# Patient Record
Sex: Male | Born: 1970
Health system: Southern US, Community
[De-identification: ages and names within clinical notes are randomized; demographics above are authoritative.]

## PROBLEM LIST (undated history)

## (undated) DIAGNOSIS — N200 Calculus of kidney: Secondary | ICD-10-CM

## (undated) DIAGNOSIS — B019 Varicella without complication: Secondary | ICD-10-CM

## (undated) DIAGNOSIS — T7840XA Allergy, unspecified, initial encounter: Secondary | ICD-10-CM

## (undated) DIAGNOSIS — E119 Type 2 diabetes mellitus without complications: Secondary | ICD-10-CM

## (undated) HISTORY — DX: Allergy, unspecified, initial encounter: T78.40XA

## (undated) HISTORY — PX: CHOLECYSTECTOMY: SHX55

## (undated) HISTORY — DX: Calculus of kidney: N20.0

## (undated) HISTORY — DX: Type 2 diabetes mellitus without complications: E11.9

## (undated) HISTORY — DX: Varicella without complication: B01.9

## (undated) HISTORY — PX: NO PAST SURGERIES: SHX2092

---

## 2010-01-04 ENCOUNTER — Emergency Department (HOSPITAL_BASED_OUTPATIENT_CLINIC_OR_DEPARTMENT_OTHER): Admission: EM | Admit: 2010-01-04 | Discharge: 2010-01-04 | Payer: Self-pay | Admitting: Emergency Medicine

## 2011-07-06 ENCOUNTER — Emergency Department: Payer: Self-pay | Admitting: *Deleted

## 2011-07-16 ENCOUNTER — Ambulatory Visit: Payer: Self-pay | Admitting: Urology

## 2011-12-20 ENCOUNTER — Emergency Department: Payer: Self-pay | Admitting: *Deleted

## 2013-01-20 DIAGNOSIS — J349 Unspecified disorder of nose and nasal sinuses: Secondary | ICD-10-CM | POA: Insufficient documentation

## 2014-06-17 ENCOUNTER — Emergency Department: Payer: Self-pay | Admitting: Emergency Medicine

## 2014-06-17 LAB — URINALYSIS, COMPLETE
BACTERIA: NONE SEEN
Bilirubin,UR: NEGATIVE
Blood: NEGATIVE
GLUCOSE, UR: NEGATIVE mg/dL (ref 0–75)
Ketone: NEGATIVE
Leukocyte Esterase: NEGATIVE
NITRITE: NEGATIVE
PH: 6 (ref 4.5–8.0)
Protein: NEGATIVE
RBC,UR: <1 /HPF (ref 0–5)
Specific Gravity: 1.015 (ref 1.003–1.030)
Squamous Epithelial: <1
WBC UR: 4 /HPF (ref 0–5)

## 2014-06-17 LAB — COMPREHENSIVE METABOLIC PANEL
Albumin: 4.2 g/dL (ref 3.4–5.0)
Alkaline Phosphatase: 90 U/L
Anion Gap: 5 — ABNORMAL LOW (ref 7–16)
BILIRUBIN TOTAL: 0.3 mg/dL (ref 0.2–1.0)
BUN: 10 mg/dL (ref 7–18)
Calcium, Total: 9.8 mg/dL (ref 8.5–10.1)
Chloride: 104 mmol/L (ref 98–107)
Co2: 29 mmol/L (ref 21–32)
Creatinine: 1.04 mg/dL (ref 0.60–1.30)
EGFR (African American): >60
Glucose: 105 mg/dL — ABNORMAL HIGH (ref 65–99)
Osmolality: 275 (ref 275–301)
POTASSIUM: 3.6 mmol/L (ref 3.5–5.1)
SGOT(AST): 36 U/L (ref 15–37)
SGPT (ALT): 60 U/L
Sodium: 138 mmol/L (ref 136–145)
Total Protein: 8 g/dL (ref 6.4–8.2)

## 2014-06-17 LAB — CBC WITH DIFFERENTIAL/PLATELET
BASOS PCT: 0.8 %
Basophil #: 0.1 10*3/uL (ref 0.0–0.1)
EOS ABS: 0.2 10*3/uL (ref 0.0–0.7)
Eosinophil %: 2.1 %
HCT: 48.1 % (ref 40.0–52.0)
HGB: 16 g/dL (ref 13.0–18.0)
LYMPHS PCT: 27.1 %
Lymphocyte #: 2.9 10*3/uL (ref 1.0–3.6)
MCH: 30 pg (ref 26.0–34.0)
MCHC: 33.2 g/dL (ref 32.0–36.0)
MCV: 90 fL (ref 80–100)
MONO ABS: 1 x10 3/mm (ref 0.2–1.0)
Monocyte %: 9.5 %
Neutrophil #: 6.5 10*3/uL (ref 1.4–6.5)
Neutrophil %: 60.5 %
PLATELETS: 287 10*3/uL (ref 150–440)
RBC: 5.32 10*6/uL (ref 4.40–5.90)
RDW: 13.3 % (ref 11.5–14.5)
WBC: 10.8 10*3/uL — ABNORMAL HIGH (ref 3.8–10.6)

## 2014-06-17 LAB — LIPASE, BLOOD: Lipase: 127 U/L (ref 73–393)

## 2014-06-17 LAB — TROPONIN I

## 2016-09-24 ENCOUNTER — Ambulatory Visit (INDEPENDENT_AMBULATORY_CARE_PROVIDER_SITE_OTHER): Payer: 59 | Admitting: Family Medicine

## 2016-09-24 ENCOUNTER — Encounter (INDEPENDENT_AMBULATORY_CARE_PROVIDER_SITE_OTHER): Payer: Self-pay

## 2016-09-24 ENCOUNTER — Encounter: Payer: Self-pay | Admitting: Family Medicine

## 2016-09-24 VITALS — BP 142/98 | HR 94 | Temp 98.4°F | Resp 14 | Ht 72.0 in | Wt 275.4 lb

## 2016-09-24 DIAGNOSIS — R030 Elevated blood-pressure reading, without diagnosis of hypertension: Secondary | ICD-10-CM | POA: Insufficient documentation

## 2016-09-24 DIAGNOSIS — Z23 Encounter for immunization: Secondary | ICD-10-CM | POA: Diagnosis not present

## 2016-09-24 DIAGNOSIS — Z13220 Encounter for screening for lipoid disorders: Secondary | ICD-10-CM | POA: Diagnosis not present

## 2016-09-24 DIAGNOSIS — Z7251 High risk heterosexual behavior: Secondary | ICD-10-CM | POA: Diagnosis not present

## 2016-09-24 DIAGNOSIS — Z113 Encounter for screening for infections with a predominantly sexual mode of transmission: Secondary | ICD-10-CM | POA: Diagnosis not present

## 2016-09-24 DIAGNOSIS — Z0000 Encounter for general adult medical examination without abnormal findings: Secondary | ICD-10-CM

## 2016-09-24 DIAGNOSIS — Z0001 Encounter for general adult medical examination with abnormal findings: Secondary | ICD-10-CM

## 2016-09-24 DIAGNOSIS — E669 Obesity, unspecified: Secondary | ICD-10-CM | POA: Diagnosis not present

## 2016-09-24 DIAGNOSIS — L409 Psoriasis, unspecified: Secondary | ICD-10-CM | POA: Diagnosis not present

## 2016-09-24 LAB — COMPREHENSIVE METABOLIC PANEL
ALBUMIN: 4.6 g/dL (ref 3.5–5.2)
ALK PHOS: 69 U/L (ref 39–117)
ALT: 41 U/L (ref 0–53)
AST: 26 U/L (ref 0–37)
BUN: 10 mg/dL (ref 6–23)
CO2: 26 mEq/L (ref 19–32)
CREATININE: 0.82 mg/dL (ref 0.40–1.50)
Calcium: 9.6 mg/dL (ref 8.4–10.5)
Chloride: 102 mEq/L (ref 96–112)
GFR: 107.41 mL/min (ref >60.00)
Glucose, Bld: 138 mg/dL — ABNORMAL HIGH (ref 70–99)
Potassium: 4.6 mEq/L (ref 3.5–5.1)
SODIUM: 137 meq/L (ref 135–145)
TOTAL PROTEIN: 7.2 g/dL (ref 6.0–8.3)
Total Bilirubin: 0.5 mg/dL (ref 0.2–1.2)

## 2016-09-24 LAB — LIPID PANEL
CHOL/HDL RATIO: 5
CHOLESTEROL: 168 mg/dL (ref 0–200)
HDL: 35.7 mg/dL — ABNORMAL LOW (ref >39.00)
NonHDL: 132.51
Triglycerides: 204 mg/dL — ABNORMAL HIGH (ref 0.0–149.0)
VLDL: 40.8 mg/dL — ABNORMAL HIGH (ref 0.0–40.0)

## 2016-09-24 LAB — LDL CHOLESTEROL, DIRECT: LDL DIRECT: 104 mg/dL

## 2016-09-24 MED ORDER — BETAMETHASONE DIPROPIONATE 0.05 % EX OINT: TOPICAL_OINTMENT | Freq: Two times a day (BID) | CUTANEOUS | 0 refills | Status: DC

## 2016-09-24 NOTE — Assessment & Plan Note (Signed)
New problem. Patient to return in 2 weeks for BP check. Labs today.

## 2016-09-24 NOTE — Progress Notes (Signed)
Subjective:  Patient ID: Christopher Booker, male    DOB: [birth year]  Age: 45 y.o. MRN: [id number]  CC: Establish care, Rash, Elevated  BP  HPI [name] is a 45 y.o. male presents to the clinic today to establish care. He is in need of an annual physical. Other issues are below.  Preventative Healthcare  Colonoscopy: Not yet indicated.  Immunizations  Tetanus - Unsure of last tetanus.   Pneumococcal - Not indicated.   Flu - Flu shot today.  Labs:  In need of screening labs today.  Exercise: No regular exercise.   Alcohol use: See below.  Smoking/tobacco use: Nonsmoker.  STD/HIV testing: Desires testing.  Regular dental exams: Yes.   Wears seat belt: Yes.   Rash  X 3 years.  Located on the anterior shins bilaterally.  No associated itching or pain.  No medications or interventions tried.  No known exacerbating or relieving factors.  No other associated symptoms.  Elevated BP  Has a history of elevated blood pressure.  He states that his blood pressure has been elevated as of late.  No associated symptoms.  No other complaints at this time.  PMH, Surgical Hx, Family Hx, Social History reviewed and updated as below.  Past Medical History:  Diagnosis Date  . Allergy   . Chicken pox   . Kidney stones    Past Surgical History:  Procedure Laterality Date  . NO PAST SURGERIES     Family History  Problem Relation Age of Onset  . Prostate cancer Maternal Grandfather   . Arthritis Paternal Grandmother    Social History  Substance Use Topics  . Smoking status: Never Smoker  . Smokeless tobacco: Never Used  . Alcohol use 3.0 oz/week    5 Standard drinks or equivalent per week   Review of Systems  HENT: Positive for tinnitus.   Musculoskeletal:       Knee pain.  Skin: Positive for rash.  Neurological: Positive for headaches.  All other systems reviewed and are negative.  Objective:   Today's Vitals: BP (!) 142/98 (BP Location: Right Arm, Patient  Position: Sitting, Cuff Size: Large)   Pulse 94   Temp 98.4 F (36.9 C) (Oral)   Resp 14   Ht 6' (1.829 m)   Wt 275 lb 6 oz (124.9 kg)   SpO2 95%   BMI 37.35 kg/m   Physical Exam  Constitutional: He is oriented to person, place, and time. He appears well-developed. No distress.  Obese.  HENT:  Head: Normocephalic and atraumatic.  Nose: Nose normal.  Mouth/Throat: Oropharynx is clear and moist. No oropharyngeal exudate.  Normal TM's bilaterally.   Eyes: Conjunctivae are normal. No scleral icterus.  Neck: Neck supple.  Cardiovascular: Normal rate and regular rhythm.   No murmur heard. Pulmonary/Chest: Effort normal and breath sounds normal. He has no wheezes. He has no rales.  Abdominal: Soft. He exhibits no distension. There is no tenderness. There is no rebound and no guarding.  Musculoskeletal: Normal range of motion. He exhibits no edema.  Lymphadenopathy:    He has no cervical adenopathy.  Neurological: He is alert and oriented to person, place, and time.  Skin:  Anterior lower legs with raised, scaly erythematous patches.  Psychiatric: He has a normal mood and affect.  Vitals reviewed.  Assessment & Plan:   Problem List Items Addressed This Visit    Screening examination for STD (sexually transmitted disease)   Relevant Orders   GC/Chlamydia Probe Amp   RPR  HIV antibody   Psoriasis    New problem. Rash appears consistent with psoriasis. Treating with topical steroid. Rx sent today.      Relevant Medications   betamethasone dipropionate (DIPROLENE) 0.05 % ointment   Other Relevant Orders   CBC   Encounter for preventative adult health care exam with abnormal findings - Primary    Flu shot given today. Screening labs today including STD testing (per patient request).      Relevant Orders   CBC   Hemoglobin A1c   Elevated BP without diagnosis of hypertension    New problem. Patient to return in 2 weeks for BP check. Labs today.        Other Visit  Diagnoses    Obesity (BMI 30-39.9)       Relevant Orders   Comp Met (CMET)   Screening, lipid       Relevant Orders   Lipid panel   Encounter for immunization       Relevant Orders   Flu Vaccine QUAD 36+ mos IM (Completed)     Outpatient Encounter Prescriptions as of 09/24/2016  Medication Sig  . betamethasone dipropionate (DIPROLENE) 0.05 % ointment Apply topically 2 (two) times daily.   No facility-administered encounter medications on file as of 09/24/2016.    Follow-up: Return in about 2 weeks (around 10/08/2016) for BP check - Nurse visit.  Brimhall Nizhoni

## 2016-09-24 NOTE — Patient Instructions (Signed)
Follow up in 2 weeks for a BP check.  Use the steroid as prescribed.  We will call with your lab results.  Take care  Dr. Adriana Simasook

## 2016-09-24 NOTE — Assessment & Plan Note (Signed)
New problem. Rash appears consistent with psoriasis. Treating with topical steroid. Rx sent today.

## 2016-09-24 NOTE — Progress Notes (Signed)
Pre visit review using our clinic review tool, if applicable. No additional management support is needed unless otherwise documented below in the visit note. 

## 2016-09-24 NOTE — Assessment & Plan Note (Signed)
Flu shot given today. Screening labs today including STD testing (per patient request).

## 2016-09-25 LAB — GC/CHLAMYDIA PROBE AMP
CT Probe RNA: NOT DETECTED
GC PROBE AMP APTIMA: NOT DETECTED

## 2016-09-30 ENCOUNTER — Other Ambulatory Visit (INDEPENDENT_AMBULATORY_CARE_PROVIDER_SITE_OTHER): Payer: 59

## 2016-09-30 ENCOUNTER — Telehealth: Payer: Self-pay | Admitting: *Deleted

## 2016-09-30 DIAGNOSIS — Z113 Encounter for screening for infections with a predominantly sexual mode of transmission: Secondary | ICD-10-CM

## 2016-09-30 DIAGNOSIS — L409 Psoriasis, unspecified: Secondary | ICD-10-CM

## 2016-09-30 DIAGNOSIS — Z0001 Encounter for general adult medical examination with abnormal findings: Secondary | ICD-10-CM | POA: Diagnosis not present

## 2016-09-30 LAB — CBC
HCT: 42.8 % (ref 39.0–52.0)
HEMOGLOBIN: 14.8 g/dL (ref 13.0–17.0)
MCHC: 34.6 g/dL (ref 30.0–36.0)
MCV: 87.1 fl (ref 78.0–100.0)
PLATELETS: 361 10*3/uL (ref 150.0–400.0)
RBC: 4.92 Mil/uL (ref 4.22–5.81)
RDW: 13.6 % (ref 11.5–15.5)
WBC: 8.2 10*3/uL (ref 4.0–10.5)

## 2016-09-30 LAB — HEMOGLOBIN A1C: Hgb A1c MFr Bld: 6.7 % — ABNORMAL HIGH (ref 4.6–6.5)

## 2016-09-30 NOTE — Telephone Encounter (Signed)
Pt was given labs and gave verbal understanding. 

## 2016-09-30 NOTE — Telephone Encounter (Signed)
Pt requested lab results  Pt contact [phone]

## 2016-10-01 LAB — RPR

## 2016-10-01 LAB — HIV ANTIBODY (ROUTINE TESTING W REFLEX): HIV: NONREACTIVE

## 2016-10-08 ENCOUNTER — Ambulatory Visit (INDEPENDENT_AMBULATORY_CARE_PROVIDER_SITE_OTHER): Payer: 59

## 2016-10-08 ENCOUNTER — Telehealth: Payer: Self-pay | Admitting: Family Medicine

## 2016-10-08 VITALS — BP 126/78 | HR 83 | Resp 16

## 2016-10-08 DIAGNOSIS — R030 Elevated blood-pressure reading, without diagnosis of hypertension: Secondary | ICD-10-CM | POA: Diagnosis not present

## 2016-10-08 NOTE — Telephone Encounter (Signed)
Pt called back returning your call. Thank you!  Call pt @ [phone]

## 2016-10-08 NOTE — Progress Notes (Signed)
Patient returns for 2 week  blood pressure check when blood pressure was elevated at last office visit.  Please advise.

## 2016-10-08 NOTE — Progress Notes (Signed)
Patient is not scheduled for return follow up appointment .  Do you want him to return in 6 months?  Please advise.

## 2016-10-08 NOTE — Progress Notes (Signed)
Prehypertension/elevated BP per new guidelines. Continue to monitor. Follow up as scheduled.   Everlene OtherJayce Christopher Picazo DO [facility] [place]

## 2016-10-08 NOTE — Progress Notes (Signed)
Left message to call.

## 2016-10-09 NOTE — Telephone Encounter (Signed)
Patient called back and advised.

## 2018-04-10 ENCOUNTER — Emergency Department: Payer: 59

## 2018-04-10 ENCOUNTER — Encounter: Payer: Self-pay | Admitting: *Deleted

## 2018-04-10 ENCOUNTER — Other Ambulatory Visit: Payer: Self-pay

## 2018-04-10 ENCOUNTER — Observation Stay
Admission: EM | Admit: 2018-04-10 | Discharge: 2018-04-11 | Disposition: A | Discharge status: Home / Self Care | Payer: 59 | Attending: Surgery | Admitting: Surgery

## 2018-04-10 DIAGNOSIS — Z79899 Other long term (current) drug therapy: Secondary | ICD-10-CM | POA: Insufficient documentation

## 2018-04-10 DIAGNOSIS — K8020 Calculus of gallbladder without cholecystitis without obstruction: Secondary | ICD-10-CM

## 2018-04-10 DIAGNOSIS — R1011 Right upper quadrant pain: Principal | ICD-10-CM | POA: Insufficient documentation

## 2018-04-10 DIAGNOSIS — I10 Essential (primary) hypertension: Secondary | ICD-10-CM | POA: Diagnosis not present

## 2018-04-10 DIAGNOSIS — Z87442 Personal history of urinary calculi: Secondary | ICD-10-CM | POA: Insufficient documentation

## 2018-04-10 DIAGNOSIS — L409 Psoriasis, unspecified: Secondary | ICD-10-CM | POA: Diagnosis not present

## 2018-04-10 DIAGNOSIS — E785 Hyperlipidemia, unspecified: Secondary | ICD-10-CM | POA: Diagnosis not present

## 2018-04-10 DIAGNOSIS — R109 Unspecified abdominal pain: Secondary | ICD-10-CM | POA: Diagnosis present

## 2018-04-10 DIAGNOSIS — E119 Type 2 diabetes mellitus without complications: Secondary | ICD-10-CM | POA: Diagnosis not present

## 2018-04-10 DIAGNOSIS — D72829 Elevated white blood cell count, unspecified: Secondary | ICD-10-CM | POA: Insufficient documentation

## 2018-04-10 LAB — CBC
HCT: 43 % (ref 40.0–52.0)
Hemoglobin: 14.7 g/dL (ref 13.0–18.0)
MCH: 30.1 pg (ref 26.0–34.0)
MCHC: 34.1 g/dL (ref 32.0–36.0)
MCV: 88.1 fL (ref 80.0–100.0)
PLATELETS: 338 10*3/uL (ref 150–440)
RBC: 4.88 MIL/uL (ref 4.40–5.90)
RDW: 13.4 % (ref 11.5–14.5)
WBC: 14.1 10*3/uL — AB (ref 3.8–10.6)

## 2018-04-10 LAB — BASIC METABOLIC PANEL
ANION GAP: 10 (ref 5–15)
BUN: 12 mg/dL (ref 6–20)
CALCIUM: 9.3 mg/dL (ref 8.9–10.3)
CO2: 24 mmol/L (ref 22–32)
CREATININE: 0.77 mg/dL (ref 0.61–1.24)
Chloride: 99 mmol/L — ABNORMAL LOW (ref 101–111)
GFR calc non Af Amer: >60 mL/min (ref >60)
Glucose, Bld: 151 mg/dL — ABNORMAL HIGH (ref 65–99)
Potassium: 3.7 mmol/L (ref 3.5–5.1)
Sodium: 133 mmol/L — ABNORMAL LOW (ref 135–145)

## 2018-04-10 LAB — URINALYSIS, COMPLETE (UACMP) WITH MICROSCOPIC
Bacteria, UA: NONE SEEN
Bilirubin Urine: NEGATIVE
GLUCOSE, UA: 50 mg/dL — AB
HGB URINE DIPSTICK: NEGATIVE
Ketones, ur: NEGATIVE mg/dL
LEUKOCYTES UA: NEGATIVE
NITRITE: NEGATIVE
PROTEIN: NEGATIVE mg/dL
Specific Gravity, Urine: 1.018 (ref 1.005–1.030)
pH: 6 (ref 5.0–8.0)

## 2018-04-10 LAB — HEPATIC FUNCTION PANEL
ALBUMIN: 4.6 g/dL (ref 3.5–5.0)
ALT: 36 U/L (ref 17–63)
AST: 27 U/L (ref 15–41)
Alkaline Phosphatase: 73 U/L (ref 38–126)
Total Bilirubin: 0.6 mg/dL (ref 0.3–1.2)
Total Protein: 7.5 g/dL (ref 6.5–8.1)

## 2018-04-10 LAB — GLUCOSE, CAPILLARY
Glucose-Capillary: 112 mg/dL — ABNORMAL HIGH (ref 65–99)
Glucose-Capillary: 101 mg/dL — ABNORMAL HIGH (ref 65–99)

## 2018-04-10 LAB — FIBRIN DERIVATIVES D-DIMER (ARMC ONLY): Fibrin derivatives D-dimer (ARMC): 192.43 ng/mL (FEU) (ref 0.00–499.00)

## 2018-04-10 LAB — TROPONIN I

## 2018-04-10 LAB — LIPASE, BLOOD: Lipase: 28 U/L (ref 11–51)

## 2018-04-10 MED ORDER — VITAMIN D 1000 UNITS PO TABS
5000.0000 [IU] | ORAL_TABLET | Freq: Every day | ORAL | Status: DC
  Administered 2018-04-10: 5000 [IU] via ORAL
  Filled 2018-04-10: qty 5

## 2018-04-10 MED ORDER — FAMOTIDINE IN NACL 20-0.9 MG/50ML-% IV SOLN
20.0000 mg | Freq: Two times a day (BID) | INTRAVENOUS | Status: DC
  Administered 2018-04-10: 20 mg via INTRAVENOUS
  Filled 2018-04-10: qty 50

## 2018-04-10 MED ORDER — VITAMIN C 500 MG PO TABS
1000.0000 mg | ORAL_TABLET | Freq: Every day | ORAL | Status: DC
Start: 2018-04-10 — End: 2018-04-11
  Administered 2018-04-10: 1000 mg via ORAL
  Filled 2018-04-10 (×2): qty 2

## 2018-04-10 MED ORDER — ADULT MULTIVITAMIN W/MINERALS CH
1.0000 | ORAL_TABLET | Freq: Every day | ORAL | Status: DC
  Administered 2018-04-10: 1 via ORAL
  Filled 2018-04-10: qty 1

## 2018-04-10 MED ORDER — LORATADINE 10 MG PO TABS
10.0000 mg | ORAL_TABLET | Freq: Every day | ORAL | Status: DC
  Administered 2018-04-10: 10 mg via ORAL
  Filled 2018-04-10: qty 1

## 2018-04-10 MED ORDER — MONTELUKAST SODIUM 10 MG PO TABS: 10.0000 mg | ORAL_TABLET | Freq: Every morning | ORAL | Status: DC

## 2018-04-10 MED ORDER — SODIUM CHLORIDE 0.9 % IV SOLN
INTRAVENOUS | Status: DC
  Administered 2018-04-10 – 2018-04-11 (×2): via INTRAVENOUS

## 2018-04-10 MED ORDER — LOSARTAN POTASSIUM 50 MG PO TABS
50.0000 mg | ORAL_TABLET | Freq: Every day | ORAL | Status: DC
  Administered 2018-04-10: 50 mg via ORAL
  Filled 2018-04-10: qty 1

## 2018-04-10 MED ORDER — GLUCOSAMINE-CHONDROITIN 500-400 MG PO TABS
2.0000 | ORAL_TABLET | Freq: Every morning | ORAL | Status: DC
Start: 2018-04-11 — End: 2018-04-10

## 2018-04-10 MED ORDER — INSULIN ASPART 100 UNIT/ML ~~LOC~~ SOLN: 0.0000 [IU] | Freq: Three times a day (TID) | SUBCUTANEOUS | Status: DC

## 2018-04-10 MED ORDER — FLUTICASONE PROPIONATE 50 MCG/ACT NA SUSP
1.0000 | Freq: Every day | NASAL | Status: DC
  Administered 2018-04-10: 1 via NASAL
  Filled 2018-04-10: qty 16

## 2018-04-10 MED ORDER — ENOXAPARIN SODIUM 40 MG/0.4ML ~~LOC~~ SOLN
40.0000 mg | SUBCUTANEOUS | Status: DC
  Administered 2018-04-10: 40 mg via SUBCUTANEOUS
  Filled 2018-04-10: qty 0.4

## 2018-04-10 NOTE — H&P (Signed)
SURGICAL CONSULTATION NOTE   HISTORY OF PRESENT ILLNESS (HPI):  47 y.o. male presented to [facility] ED for evaluation of abdominal pain. Patient reports yesterday around 10pm started with pain on the epigastric area that radiated to the right upper quadrant and to the back. Pain was associated with nausea. Has not taken anything to pain. Cannot recall any associated factor. Cannot associated pain to food intake. This is the first episodes of this type of pain. Refers associated nausea but no vomiting. Denies diarrhea, fever or chills.  Surgery is consulted by Dr. Pershing Proud in this context for evaluation and management of abdominal pain.  PAST MEDICAL HISTORY (PMH):  Past Medical History:  Diagnosis Date  . Allergy   . Chicken pox   . Kidney stones      PAST SURGICAL HISTORY [facility]):  Past Surgical History:  Procedure Laterality Date  . NO PAST SURGERIES       MEDICATIONS:  Prior to Admission medications   Medication Sig Start Date End Date Taking? Authorizing Provider  betamethasone dipropionate (DIPROLENE) 0.05 % ointment Apply topically 2 (two) times daily. 09/24/16   Tommie Sams, DO     ALLERGIES:  No Known Allergies   SOCIAL HISTORY:  Social History   Socioeconomic History  . Marital status: Single    Spouse name: Not on file  . Number of children: Not on file  . Years of education: Not on file  . Highest education level: Not on file  Occupational History  . Not on file  Social Needs  . Financial resource strain: Not on file  . Food insecurity:    Worry: Not on file    Inability: Not on file  . Transportation needs:    Medical: Not on file    Non-medical: Not on file  Tobacco Use  . Smoking status: Never Smoker  . Smokeless tobacco: Never Used  Substance and Sexual Activity  . Alcohol use: Yes    Alcohol/week: 3.0 oz    Types: 5 Standard drinks or equivalent per week  . Drug use: No  . Sexual activity: Yes    Partners: Male  Lifestyle  . Physical activity:     Days per week: Not on file    Minutes per session: Not on file  . Stress: Not on file  Relationships  . Social connections:    Talks on phone: Not on file    Gets together: Not on file    Attends religious service: Not on file    Active member of club or organization: Not on file    Attends meetings of clubs or organizations: Not on file    Relationship status: Not on file  . Intimate partner violence:    Fear of current or ex partner: Not on file    Emotionally abused: Not on file    Physically abused: Not on file    Forced sexual activity: Not on file  Other Topics Concern  . Not on file  Social History Narrative  . Not on file    The patient currently resides (home / rehab facility / nursing home): Home The patient normally is (ambulatory / bedbound): Ambulatory   FAMILY HISTORY:  Family History  Problem Relation Age of Onset  . Prostate cancer Maternal Grandfather   . Arthritis Paternal Grandmother      REVIEW OF SYSTEMS:  Constitutional: denies weight loss, fever, chills, or sweats  Eyes: denies any other vision changes, history of eye injury  ENT: denies sore  throat, hearing problems  Respiratory: denies shortness of breath, wheezing  Cardiovascular: denies chest pain, palpitations  Gastrointestinal: See HPI Genitourinary: denies burning with urination or urinary frequency Musculoskeletal: denies any other joint pains or cramps  Skin: denies any other rashes or skin discolorations  Neurological: denies any other headache, dizziness, weakness  Psychiatric: denies any other depression, anxiety   All other review of systems were negative   VITAL SIGNS:  Temp:  [97.7 F (36.5 C)-97.8 F (36.6 C)] 97.8 F (36.6 C) (05/19 0815) Pulse Rate:  [74-108] 108 (05/19 0930) Resp:  [12-18] 14 (05/19 0930) BP: (141-169)/(83-94) 141/83 (05/19 0930) SpO2:  [95 %-98 %] 98 % (05/19 0930) Weight:  [117.9 kg (260 lb)] 117.9 kg (260 lb) (05/19 0305)     Height: 6' (182.9 cm)  Weight: 117.9 kg (260 lb) BMI (Calculated): 35.25   INTAKE/OUTPUT:  This shift: No intake/output data recorded.  Last 2 shifts: @   PHYSICAL EXAM:  Constitutional:  -- Normal body habitus  -- Awake, alert, and oriented x3  Eyes:  -- Pupils equally round and reactive to light  -- No scleral icterus  Ear, nose, and throat:  -- No jugular venous distension  Pulmonary:  -- No crackles  -- Equal breath sounds bilaterally -- Breathing non-labored at rest Cardiovascular:  -- S1, S2 present  -- No pericardial rubs Gastrointestinal:  -- Abdomen soft, mild tender to palpation on the right upper quadrant, non-distended -- No abdominal masses appreciated, pulsatile or otherwise  Musculoskeletal and Integumentary:  -- Wounds or skin discoloration: None appreciated -- Extremities: B/L UE and LE FROM, hands and feet warm, no edema  Neurologic:  -- Motor function: intact and symmetric -- Sensation: intact and symmetric   Labs:  CBC Latest Ref Rng & Units 04/10/2018 09/30/2016 06/17/2014  WBC 3.8 - 10.6 K/uL 14.1(H) 8.2 10.8(H)  Hemoglobin 13.0 - 18.0 g/dL 69.6 29.5 28.4  Hematocrit 40.0 - 52.0 % 43.0 42.8 48.1  Platelets 150 - 440 K/uL 338 361.0 287   CMP Latest Ref Rng & Units 04/10/2018 09/24/2016 06/17/2014  Glucose 65 - 99 mg/dL 132(G) 401(U) 272(Z)  BUN 6 - 20 mg/dL Creatinine 0.61 - 1.24 mg/dL 3.66 4.40 3.47  Sodium 135 - 145 mmol/L 133(L) 137 138  Potassium 3.5 - 5.1 mmol/L 3.7 4.6 3.6  Chloride 101 - 111 mmol/L 99(L) 102 104  CO2 22 - 32 mmol/L Calcium 8.9 - 10.3 mg/dL 9.3 9.6 9.8  Total Protein 6.5 - 8.1 g/dL 7.5 7.2 8.0  Total Bilirubin 0.3 - 1.2 mg/dL 0.6 0.5 0.3  Alkaline Phos 38 - 126 U/L 73 69 90  AST 15 - 41 U/L 27 26 36  Shankel 17 - 63 U/L 36 41 60     Imaging studies:  Personally reviewed the Right upper quadrant ultrasound that shows the gallbladder without stones or wall thickening and no peri cholecystic fluid. It was reported  that patient had positive murphy's sign.   Assessment/Plan: 47 y.o. male with PMHx of DM, HTN and hyperlipidemia with abdominal pain and suspected cholecystitis. History and physical exam sounds like suspected cholecystitis but imaging shows no stone on the gallbladder, no wall thickening and no pericholecystic fluid. Other differential may be gastritis, enteritis, peptic ulcer disease. Patient does not take any NSAID regularly.    Patient has leukocytosis and has not eaten since yesterday. My plan is admit the patient to the hospital for observation. If pain continue to improve without  antibiotic therapy, new labs in the morning are normal, patient tolerates pain without pain, may consider discharge. If patient with persistent leukocytosis or pain or any other concerning symptoms will order the HIDA scan to rule out cholecystitis and the possible need for cholecystectomy.   All of the above findings and recommendations were discussed with the patient and his family, and all of patient's and his family's questions were answered to their expressed satisfaction.  Gae Gallop, MD

## 2018-04-10 NOTE — ED Provider Notes (Signed)
[facility] Emergency Department Provider Note  ___________________________________________   First MD Initiated Contact with Patient 04/10/18 0802     (approximate)  I have reviewed the triage vital signs and the nursing notes.   HISTORY  Chief Complaint Chest Pain and Back Pain   HPI Christopher Booker is a 47 y.o. male with a history of kidney stones is presenting to the emergency department sudden onset abdominal pain at 10 PM last night.  He says the pain is now a 7 out of 10 and radiating through to his back.  It is associated with nausea but no vomiting or diarrhea.  Patient was recently diagnosed diabetes, hypertension and hyperlipidemia.  Says that he is medicated for all these things.  Says that he has never had surgery on his abdomen.  Says that he drinks infrequently.  No known history of gallstones.  Says that he has not had previous symptoms similar to this.  Denies symptoms previous to this after eating.   Past Medical History:  Diagnosis Date  . Allergy   . Chicken pox   . Kidney stones     Patient Active Problem List   Diagnosis Date Noted  . Screening examination for STD (sexually transmitted disease) 09/24/2016  . Encounter for preventative adult health care exam with abnormal findings 09/24/2016  . Psoriasis 09/24/2016  . Elevated BP without diagnosis of hypertension 09/24/2016    Past Surgical History:  Procedure Laterality Date  . NO PAST SURGERIES      Prior to Admission medications   Medication Sig Start Date End Date Taking? Authorizing Provider  betamethasone dipropionate (DIPROLENE) 0.05 % ointment Apply topically 2 (two) times daily. 09/24/16   Tommie Sams, DO    Allergies Patient has no known allergies.  Family History  Problem Relation Age of Onset  . Prostate cancer Maternal Grandfather   . Arthritis Paternal Grandmother     Social History Social History   Tobacco Use  . Smoking status: Never Smoker  . Smokeless  tobacco: Never Used  Substance Use Topics  . Alcohol use: Yes    Alcohol/week: 3.0 oz    Types: 5 Standard drinks or equivalent per week  . Drug use: No    Review of Systems  Constitutional: No fever/chills Eyes: No visual changes. ENT: No sore throat. Cardiovascular: Denies chest pain. Respiratory: Denies shortness of breath. Gastrointestinal: no vomiting.  No diarrhea.  No constipation. Genitourinary: Negative for dysuria. Musculoskeletal: Negative for back pain. Skin: Negative for rash. Neurological: Negative for headaches, focal weakness or numbness.   ____________________________________________   PHYSICAL EXAM:  VITAL SIGNS: ED Triage Vitals  Enc Vitals Group     BP 04/10/18 0309 (!) 169/93     Pulse Rate 04/10/18 0309 74     Resp 04/10/18 0309 18     Temp 04/10/18 0309 97.7 F (36.5 C)     Temp Source 04/10/18 0309 Oral     SpO2 04/10/18 0309 95 %     Weight 04/10/18 0305 260 lb (117.9 kg)     Height 04/10/18 0305 6' (1.829 m)     Head Circumference --      Peak Flow --      Pain Score 04/10/18 0305 7     Pain Loc --      Pain Edu? --      Excl. in GC? --     Constitutional: Alert and oriented. Well appearing and in no acute distress. Eyes: Conjunctivae are normal.  Head: Atraumatic. Nose: No congestion/rhinnorhea. Mouth/Throat: Mucous membranes are moist.  Neck: No stridor.   Cardiovascular: Normal rate, regular rhythm. Grossly normal heart sounds.   Respiratory: Normal respiratory effort.  No retractions. Lungs CTAB. Gastrointestinal: Soft with epigastric as well as right upper quadrant tenderness with a negative Murphy sign.  Tenderness is mild to moderate.  No distention. No CVA tenderness. Musculoskeletal: No lower extremity tenderness nor edema.  No joint effusions. Neurologic:  Normal speech and language. No gross focal neurologic deficits are appreciated. Skin:  Skin is warm, dry and intact. No rash noted. Psychiatric: Mood and affect are  normal. Speech and behavior are normal.  ____________________________________________   LABS (all labs ordered are listed, but only abnormal results are displayed)  Labs Reviewed  BASIC METABOLIC PANEL - Abnormal; Notable for the following components:      Result Value   Sodium 133 (*)    Chloride 99 (*)    Glucose, Bld 151 (*)    All other components within normal limits  CBC - Abnormal; Notable for the following components:   WBC 14.1 (*)    All other components within normal limits  HEPATIC FUNCTION PANEL - Abnormal; Notable for the following components:   Bilirubin, Direct <0.1 (*)    All other components within normal limits  URINALYSIS, COMPLETE (UACMP) WITH MICROSCOPIC - Abnormal; Notable for the following components:   Color, Urine YELLOW (*)    APPearance CLEAR (*)    Glucose, UA 50 (*)    All other components within normal limits  TROPONIN I  LIPASE, BLOOD  FIBRIN DERIVATIVES D-DIMER ([facility] ONLY)   ____________________________________________  EKG  ED ECG REPORT I, Arelia Longest, the attending physician, personally viewed and interpreted this ECG.   Date: 04/10/2018  EKG Time: 0307  Rate: 74  Rhythm: normal sinus rhythm with sinus arrhythmia  Axis: Normal  Intervals:none  ST&T Change: No ST segment elevation or depression.  No abnormal T wave inversion.  ____________________________________________  RADIOLOGY  Chest x-ray without any acute pathology. ____________________________________________   PROCEDURES  Procedure(s) performed:   Procedures  Critical Care performed:   ____________________________________________   INITIAL IMPRESSION / ASSESSMENT AND PLAN / ED COURSE  Pertinent labs & imaging results that were available during my care of the patient were reviewed by me and considered in my medical decision making (see chart for details).  Differential diagnosis includes, but is not limited to, biliary disease (biliary colic, acute  cholecystitis, cholangitis, choledocholithiasis, etc), intrathoracic causes for epigastric abdominal pain including ACS, gastritis, duodenitis, pancreatitis, small bowel or large bowel obstruction, abdominal aortic aneurysm, hernia, and ulcer(s). As part of my medical decision making, I reviewed the following data within the electronic MEDICAL RECORD NUMBERFrom outpatient visits.  ----------------------------------------- 11:18 AM on 04/10/2018 -----------------------------------------  Patient evaluated by surgery, Dr. Maia Plan, who will be admitting the patient for observation. ____________________________________________   FINAL CLINICAL IMPRESSION(S) / ED DIAGNOSES  epiGastric and right upper quadrant abdominal pain.    NEW MEDICATIONS STARTED DURING THIS VISIT:  New Prescriptions   No medications on file     Note:  This document was prepared using Dragon voice recognition software and may include unintentional dictation errors.     Myrna Blazer, MD 04/10/18 [phone]

## 2018-04-10 NOTE — Progress Notes (Signed)
PHARMACIST - PHYSICIAN ORDER COMMUNICATION  CONCERNING: P&T Medication Policy on Herbal Medications  DESCRIPTION:  This patient's order for:  Glucosamine-chondroitin  has been noted.  This product(s) is classified as an "herbal" or natural product. Due to a lack of definitive safety studies or FDA approval, nonstandard manufacturing practices, plus the potential risk of unknown drug-drug interactions while on inpatient medications, the Pharmacy and Therapeutics Committee does not permit the use of "herbal" or natural products of this type within [place].   ACTION TAKEN: The pharmacy department is unable to verify this order at this time  Please reevaluate patient's clinical condition at discharge and address if the herbal or natural product(s) should be resumed at that time.    

## 2018-04-10 NOTE — ED Triage Notes (Signed)
Patient reports pain for the past 4-5 hours.  Reports pain mid sternal and and in his back.

## 2018-04-11 DIAGNOSIS — R1011 Right upper quadrant pain: Secondary | ICD-10-CM | POA: Diagnosis not present

## 2018-04-11 LAB — CBC WITH DIFFERENTIAL/PLATELET
Basophils Absolute: 0.1 10*3/uL (ref 0–0.1)
Basophils Relative: 1 %
EOS PCT: 2 %
Eosinophils Absolute: 0.2 10*3/uL (ref 0–0.7)
HEMATOCRIT: 43 % (ref 40.0–52.0)
Hemoglobin: 14.6 g/dL (ref 13.0–18.0)
LYMPHS PCT: 35 %
Lymphs Abs: 2.7 10*3/uL (ref 1.0–3.6)
MCH: 30 pg (ref 26.0–34.0)
MCHC: 33.9 g/dL (ref 32.0–36.0)
MCV: 88.6 fL (ref 80.0–100.0)
MONO ABS: 0.9 10*3/uL (ref 0.2–1.0)
MONOS PCT: 11 %
NEUTROS ABS: 3.9 10*3/uL (ref 1.4–6.5)
Neutrophils Relative %: 51 %
Platelets: 277 10*3/uL (ref 150–440)
RBC: 4.86 MIL/uL (ref 4.40–5.90)
RDW: 13.9 % (ref 11.5–14.5)
WBC: 7.7 10*3/uL (ref 3.8–10.6)

## 2018-04-11 LAB — GLUCOSE, CAPILLARY: Glucose-Capillary: 108 mg/dL — ABNORMAL HIGH (ref 65–99)

## 2018-04-11 NOTE — Progress Notes (Signed)
Discharge order received. Patient is alert and oriented. Vital signs stable . No signs of acute distress. Discharge instructions given. Patient verbalized understanding. No other issues noted at this time.   

## 2018-04-25 NOTE — Discharge Summary (Signed)
Physician Discharge Summary  Patient ID: Christopher BostonJay Booker MRN: [id number] DOB/AGE: 48-Dec-1972 48 y.o.  Admit date: 04/10/2018 Discharge date: 04/11/2018  Admission Diagnoses:  Discharge Diagnoses:  Active Problems:   Abdominal pain   Discharged Condition: good  Hospital Course: 47 y.o. male presented to [facility] ED for abdominal pain, clinically concerning for cholecystitis with leukocytosis to wbc 14.1. However, workup including RUQ abdominal ultrasound did not demonstrated cholelithiasis or cholecystitis. Patient was admitted for monitoring of abdominal exam with plans for further workup including next-day HIDA if patient's symptoms or leukocytosis persisted. However, all of patient's symptoms and his leukocytosis completely resolved, and advancement of patient's diet and ambulation were well-tolerated. The remainder of patient's hospital course was essentially unremarkable, and discharge planning was initiated accordingly with patient safely able to be discharged home with appropriate discharge instructions and outpatient follow-up after all of his questions were answered to his expressed satisfaction.  Consults: None  Significant Diagnostic Studies: radiology: Ultrasound: no sonographic evidence of cholecystitis  Labs: wbc 7.7 (from 14.1 at admission)  Treatments: IV hydration  Discharge Exam: Blood pressure 129/88, pulse 65, temperature 97.8 F (36.6 C), temperature source Oral, resp. rate 16, height 6' (1.829 m), weight 260 lb (117.9 kg), SpO2 98 %. General appearance: alert, cooperative and no distress GI: soft, non-tender; bowel sounds normal; no masses,  no organomegaly  Disposition:    Allergies as of 04/11/2018   No Known Allergies     Medication List    TAKE these medications   atorvastatin 20 MG tablet Commonly known as:  LIPITOR Take 20 mg by mouth daily.   cholecalciferol 400 units Tabs tablet Commonly known as:  VITAMIN D Take 5,000 Units by mouth.   fexofenadine 180  MG tablet Commonly known as:  ALLEGRA Take 180 mg by mouth daily.   fluticasone 50 MCG/ACT nasal spray Commonly known as:  FLONASE Place 1 spray into both nostrils daily.   glucosamine-chondroitin 500-400 MG tablet Take 2 tablets by mouth every morning.   losartan 25 MG tablet Commonly known as:  COZAAR Take 50 mg by mouth daily.   montelukast 10 MG tablet Commonly known as:  SINGULAIR Take 10 mg by mouth every morning.   MULTI-VITAMINS Tabs Take 1 tablet by mouth daily.   omeprazole 20 MG capsule Commonly known as:  PRILOSEC Take 1 capsule by mouth daily as needed.   vitamin C 1000 MG tablet Take 1,000 mg by mouth daily.      Follow-up Information    Ancil Linseyavis, Shiva Sahagian Evan, MD Follow up.   Specialty:  General Surgery Why:  Please call office if any non-emergent surgical questions or concerns or to schedule follow-up appointment. Contact information: [address] [place] [address] [phone]           Signed: Ancil LinseyJason Evan Herberto Ledwell 04/25/2018, 3:18 AM

## 2018-05-08 DIAGNOSIS — K8020 Calculus of gallbladder without cholecystitis without obstruction: Secondary | ICD-10-CM

## 2019-08-28 IMAGING — CR DG CHEST 2V
2 series · 2 of 2 positions shown · non-contrast
Comparison: None.

CLINICAL DATA: Chest pain for the past 4-5 hours.

EXAM:
CHEST - 2 VIEW

[chest pa]
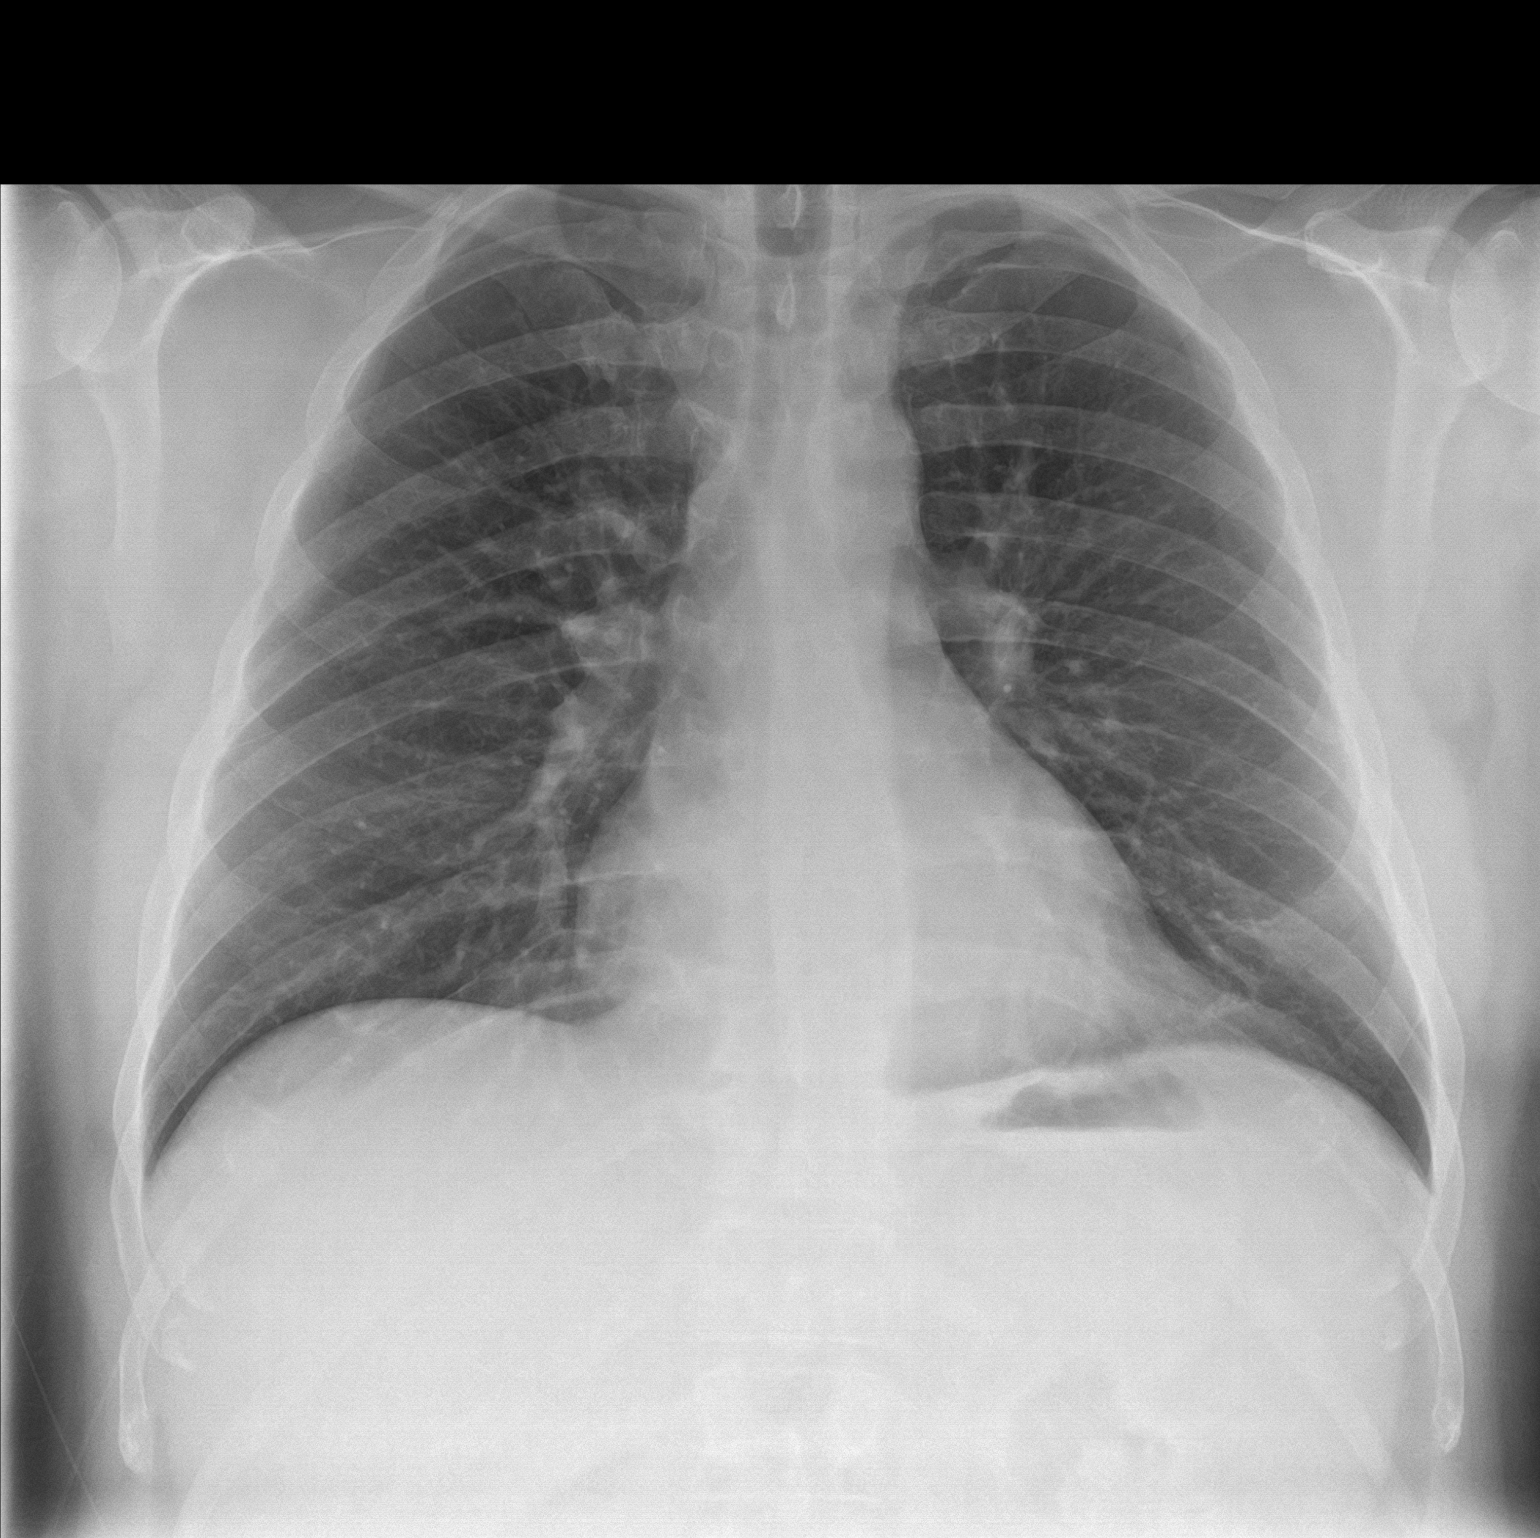

[chest lat]
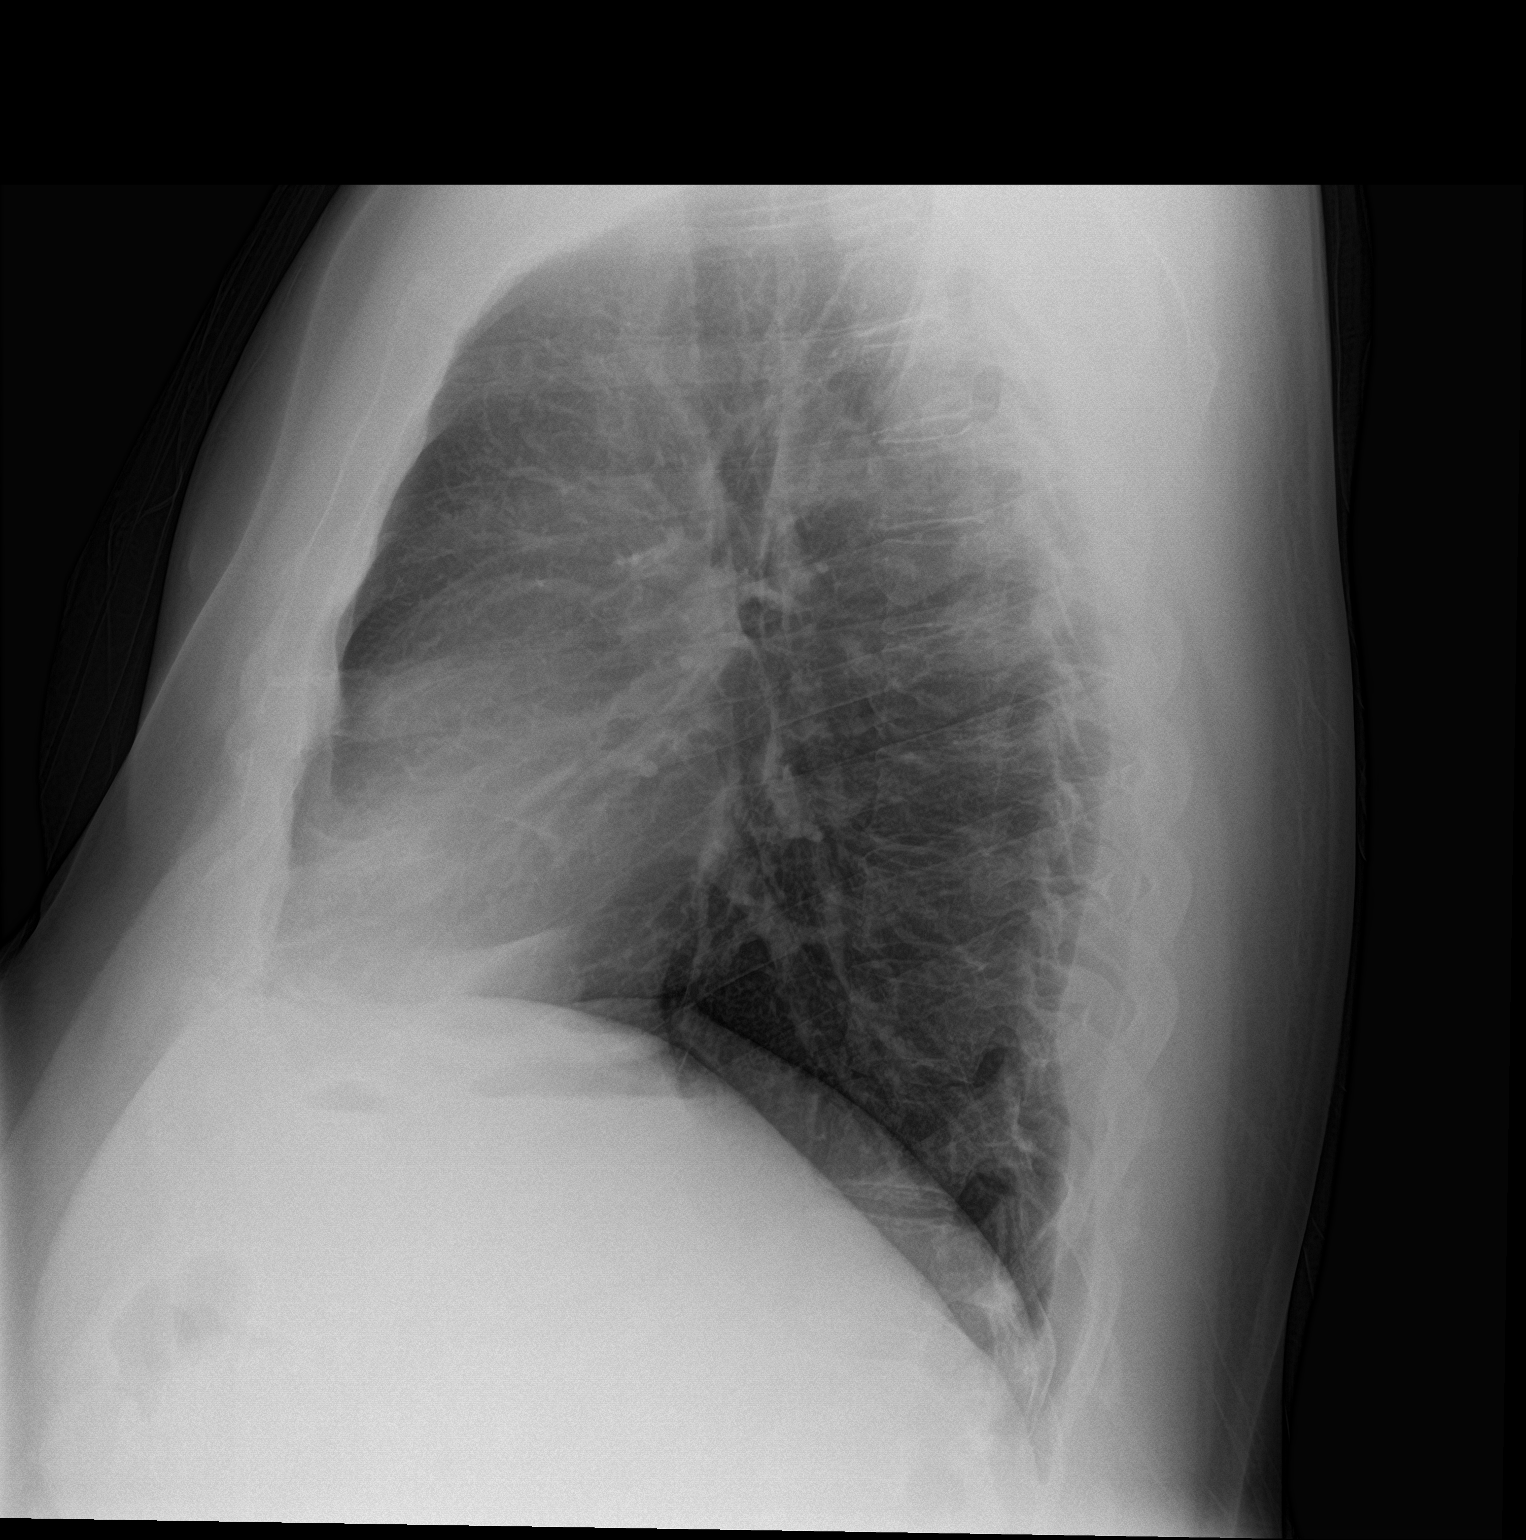

[2 of 2 positions shown; findings below may reference images not displayed]

FINDINGS: The heart size and mediastinal contours are within normal limits.
Both lungs are clear. The visualized skeletal structures are
unremarkable.
IMPRESSION: No active cardiopulmonary disease.

## 2019-08-28 IMAGING — US US ABDOMEN LIMITED
1 series · 14 of 25 positions shown · non-contrast
Comparison: Right upper quadrant ultrasound dated June 18, 2014.

CLINICAL DATA: Upper abdominal pain for the past 8 hours.

EXAM:
ULTRASOUND ABDOMEN LIMITED RIGHT UPPER QUADRANT

[Series 1: us abdomen limited · 14 of 54 slices shown]
[im 1/54]
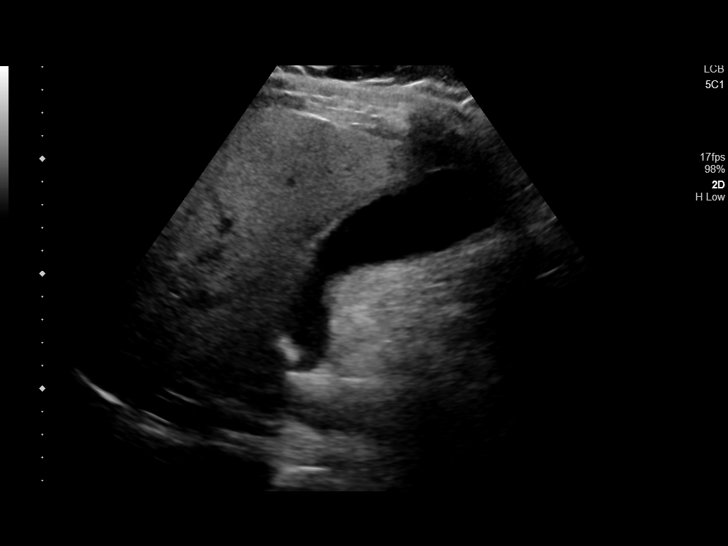
[im 5/54]
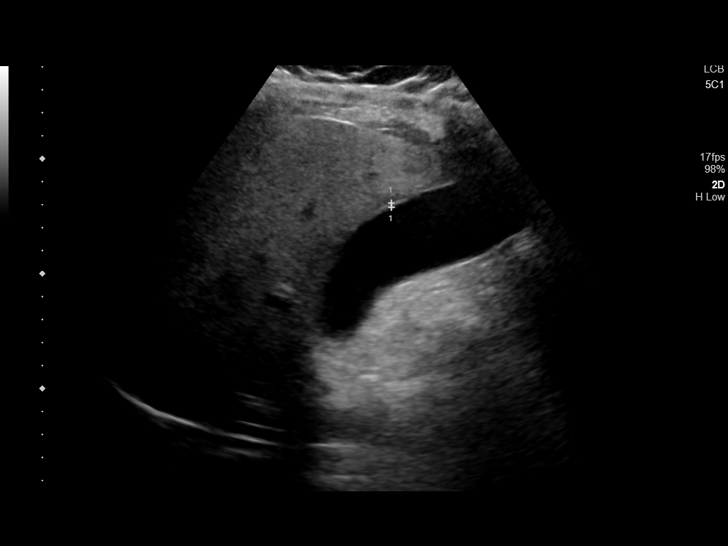
[im 9/54]
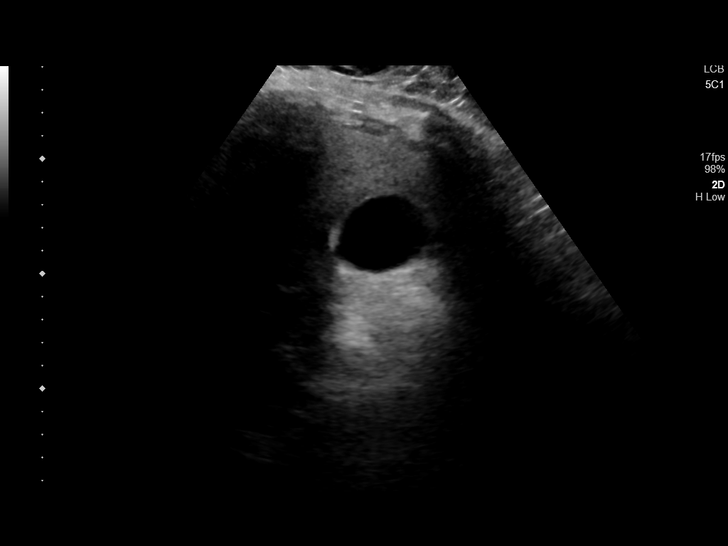
[im 14/54]
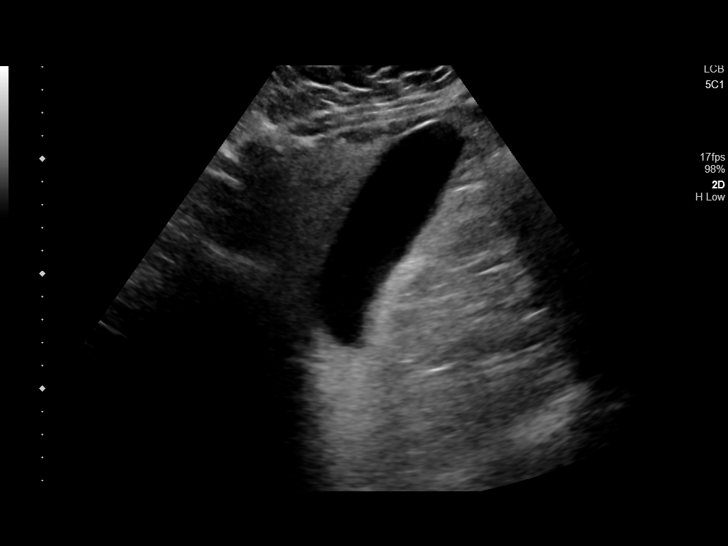
[im 18/54]
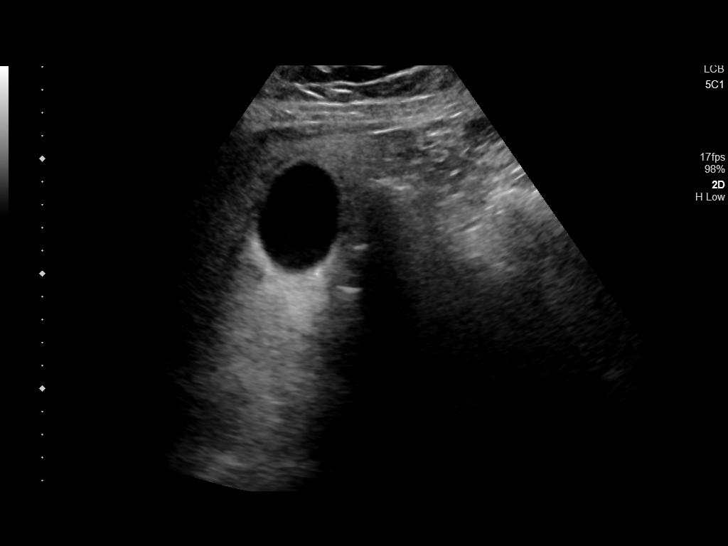
[im 20/54]
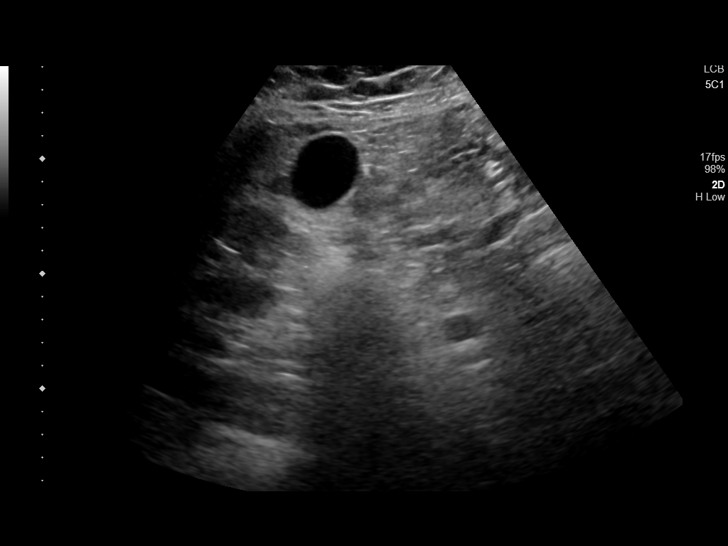
[im 25/54]
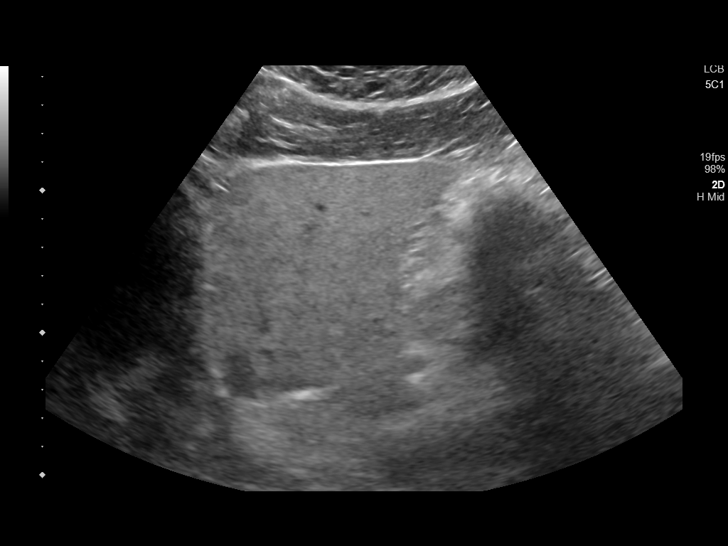
[im 29/54]
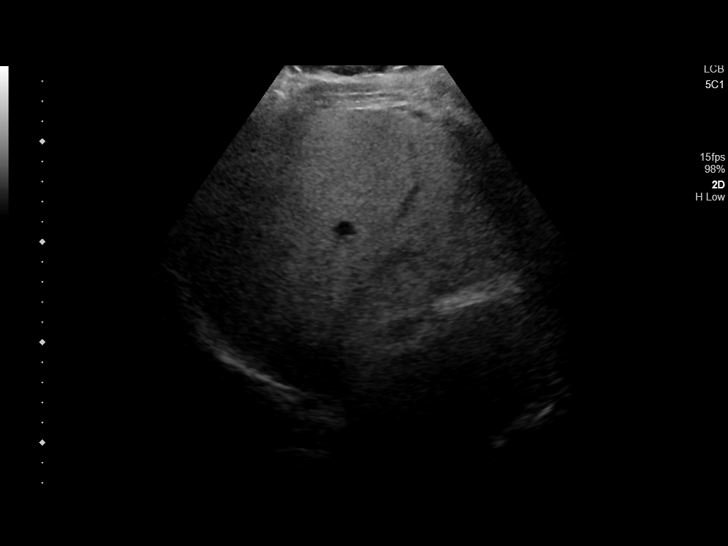
[im 34/54]
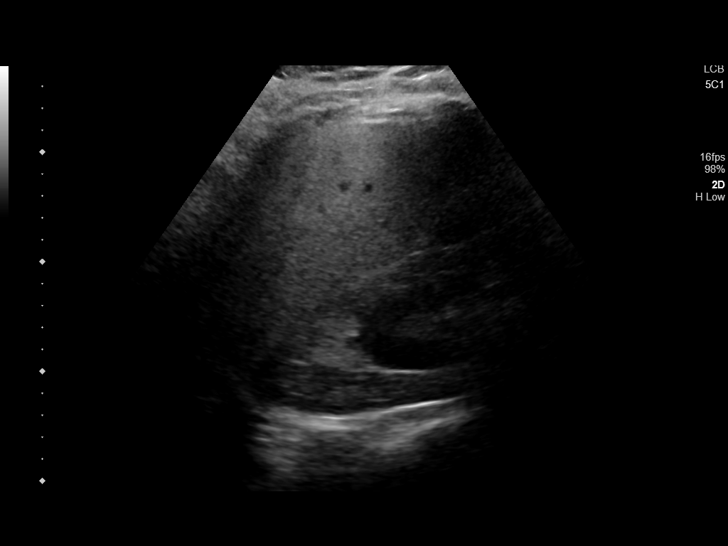
[im 36/54]
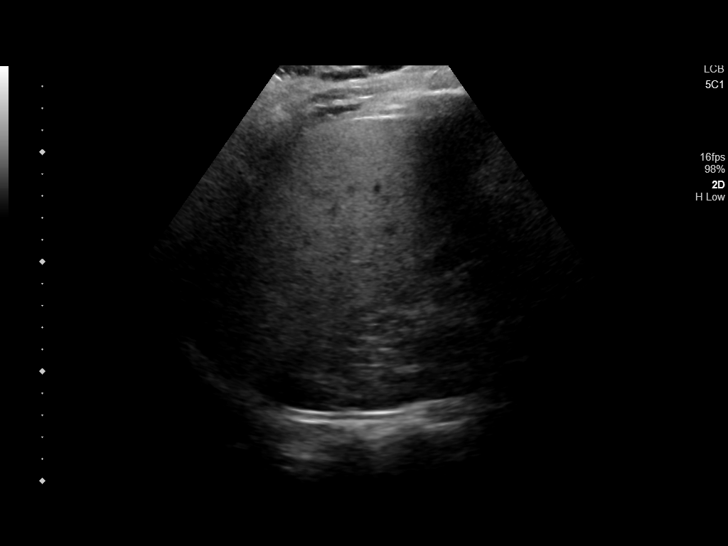
[im 40/54]
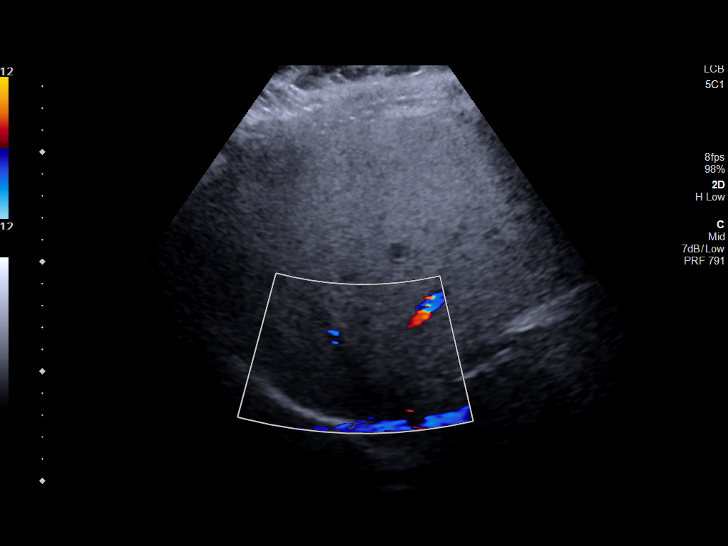
[im 45/54]
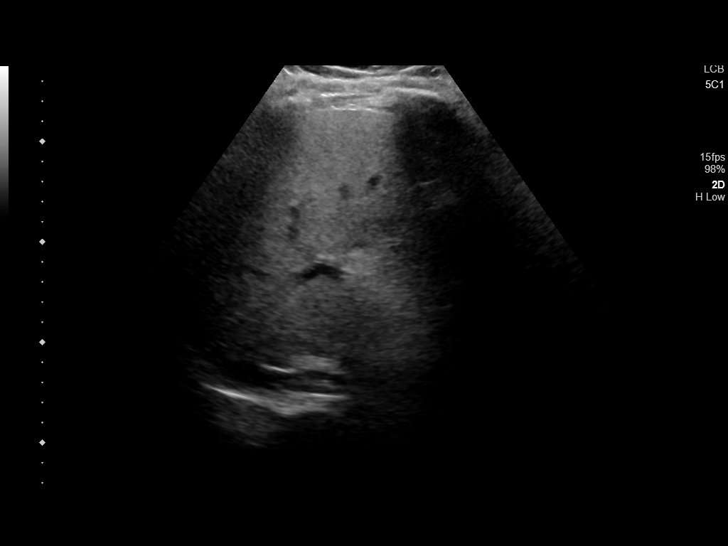
[im 49/54]
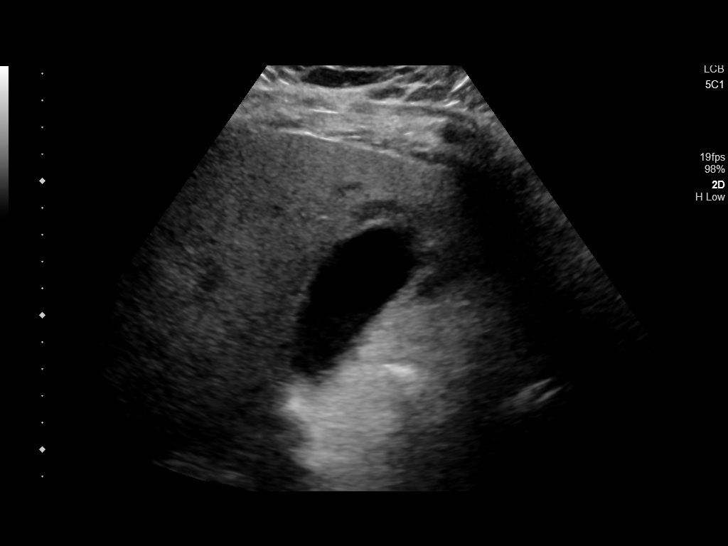
[im 54/54]
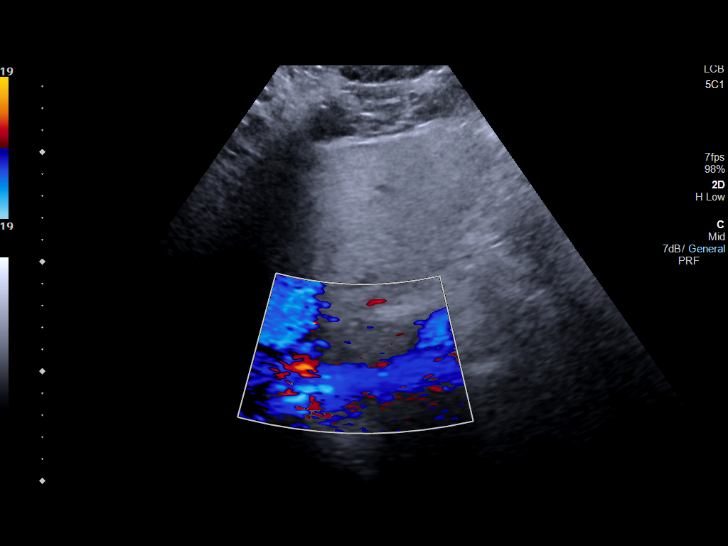

[14 of 25 positions shown; findings below may reference images not displayed]

FINDINGS: Gallbladder:

No gallstones or wall thickening visualized. Positive sonographic
Murphy sign noted by sonographer.

Common bile duct:

Diameter: 6 mm, normal.

Liver:

No focal lesion identified. Diffusely increased in parenchymal
echogenicity, with areas of focal fatty sparing along the
gallbladder fossa, unchanged. Portal vein is patent on color Doppler
imaging with normal direction of blood flow towards the liver.
IMPRESSION: 1. Positive sonographic Murphy sign noted by sonographer. However,
the gallbladder is sonographically normal in appearance without wall
thickening or gallstones. If there is clinical concern for acute
cholecystitis, consider HIDA scan for further evaluation.
2. Diffuse hepatic steatosis.

## 2020-04-11 ENCOUNTER — Other Ambulatory Visit: Payer: Self-pay | Admitting: Gastroenterology

## 2020-04-11 DIAGNOSIS — D72829 Elevated white blood cell count, unspecified: Secondary | ICD-10-CM

## 2020-04-11 DIAGNOSIS — R1013 Epigastric pain: Secondary | ICD-10-CM

## 2020-04-11 DIAGNOSIS — R110 Nausea: Secondary | ICD-10-CM

## 2020-04-11 DIAGNOSIS — R1011 Right upper quadrant pain: Secondary | ICD-10-CM

## 2020-04-12 ENCOUNTER — Other Ambulatory Visit: Payer: Self-pay

## 2020-04-12 ENCOUNTER — Ambulatory Visit
Admission: RE | Admit: 2020-04-12 | Discharge: 2020-04-12 | Disposition: A | Discharge status: Home / Self Care | Payer: 59 | Source: Ambulatory Visit | Attending: Gastroenterology | Admitting: Gastroenterology

## 2020-04-12 DIAGNOSIS — R1013 Epigastric pain: Secondary | ICD-10-CM | POA: Diagnosis not present

## 2020-04-12 DIAGNOSIS — R1011 Right upper quadrant pain: Secondary | ICD-10-CM | POA: Diagnosis present

## 2020-04-12 DIAGNOSIS — R110 Nausea: Secondary | ICD-10-CM | POA: Insufficient documentation

## 2020-04-12 DIAGNOSIS — D72829 Elevated white blood cell count, unspecified: Secondary | ICD-10-CM | POA: Insufficient documentation

## 2020-04-18 ENCOUNTER — Other Ambulatory Visit: Payer: Self-pay

## 2020-04-18 ENCOUNTER — Ambulatory Visit: Payer: Self-pay | Admitting: Surgery

## 2020-04-18 ENCOUNTER — Telehealth: Payer: Self-pay | Admitting: Surgery

## 2020-04-18 ENCOUNTER — Encounter: Payer: Self-pay | Admitting: Surgery

## 2020-04-18 ENCOUNTER — Ambulatory Visit (INDEPENDENT_AMBULATORY_CARE_PROVIDER_SITE_OTHER): Payer: 59 | Admitting: Surgery

## 2020-04-18 VITALS — BP 130/75 | HR 93 | Temp 97.7°F | Resp 12 | Ht 72.0 in | Wt 244.4 lb

## 2020-04-18 DIAGNOSIS — K8010 Calculus of gallbladder with chronic cholecystitis without obstruction: Secondary | ICD-10-CM | POA: Diagnosis not present

## 2020-04-18 NOTE — Patient Instructions (Addendum)
Our surgery scheduler will contact you to schedule your surgery. Please have the Blue Sheet available when she calls you.   Call the office if you have any questions or concerns.  Laparoscopic Cholecystectomy Laparoscopic cholecystectomy is surgery to remove the gallbladder. The gallbladder is a pear-shaped organ that lies beneath the liver on the right side of the body. The gallbladder stores bile, which is a fluid that helps the body to digest fats. Cholecystectomy is often done for inflammation of the gallbladder (cholecystitis). This condition is usually caused by a buildup of gallstones (cholelithiasis) in the gallbladder. Gallstones can block the flow of bile, which can result in inflammation and pain. In severe cases, emergency surgery may be required. This procedure is done though small incisions in your abdomen (laparoscopic surgery). A thin scope with a camera (laparoscope) is inserted through one incision. Thin surgical instruments are inserted through the other incisions. In some cases, a laparoscopic procedure may be turned into a type of surgery that is done through a larger incision (open surgery). Tell a health care provider about:  Any allergies you have.  All medicines you are taking, including vitamins, herbs, eye drops, creams, and over-the-counter medicines.  Any problems you or family members have had with anesthetic medicines.  Any blood disorders you have.  Any surgeries you have had.  Any medical conditions you have.  Whether you are pregnant or may be pregnant. What are the risks? Generally, this is a safe procedure. However, problems may occur, including:  Infection.  Bleeding.  Allergic reactions to medicines.  Damage to other structures or organs.  A stone remaining in the common bile duct. The common bile duct carries bile from the gallbladder into the small intestine.  A bile leak from the cyst duct that is clipped when your gallbladder is  removed. What happens before the procedure? Staying hydrated Follow instructions from your health care provider about hydration, which may include:  Up to 2 hours before the procedure - you may continue to drink clear liquids, such as water, clear fruit juice, black coffee, and plain tea. Eating and drinking restrictions Follow instructions from your health care provider about eating and drinking, which may include:  8 hours before the procedure - stop eating heavy meals or foods such as meat, fried foods, or fatty foods.  6 hours before the procedure - stop eating light meals or foods, such as toast or cereal.  6 hours before the procedure - stop drinking milk or drinks that contain milk.  2 hours before the procedure - stop drinking clear liquids. Medicines  Ask your health care provider about: ? Changing or stopping your regular medicines. This is especially important if you are taking diabetes medicines or blood thinners. ? Taking medicines such as aspirin and ibuprofen. These medicines can thin your blood. Do not take these medicines before your procedure if your health care provider instructs you not to.  You may be given antibiotic medicine to help prevent infection. General instructions  Let your health care provider know if you develop a cold or an infection before surgery.  Plan to have someone take you home from the hospital or clinic.  Ask your health care provider how your surgical site will be marked or identified. What happens during the procedure?   To reduce your risk of infection: ? Your health care team will wash or sanitize their hands. ? Your skin will be washed with soap. ? Hair may be removed from the surgical area.    area.  An IV tube may be inserted into one of your veins.  You will be given one or more of the following: ? A medicine to help you relax (sedative). ? A medicine to make you fall asleep (general anesthetic).  A breathing tube will be placed in  your mouth.  Your surgeon will make several small cuts (incisions) in your abdomen.  The laparoscope will be inserted through one of the small incisions. The camera on the laparoscope will send images to a TV screen (monitor) in the operating room. This lets your surgeon see inside your abdomen.  Air-like gas will be pumped into your abdomen. This will expand your abdomen to give the surgeon more room to perform the surgery.  Other tools that are needed for the procedure will be inserted through the other incisions. The gallbladder will be removed through one of the incisions.  Your common bile duct may be examined. If stones are found in the common bile duct, they may be removed.  After your gallbladder has been removed, the incisions will be closed with stitches (sutures), staples, or skin glue.  Your incisions may be covered with a bandage (dressing). The procedure may vary among health care providers and hospitals. What happens after the procedure?  Your blood pressure, heart rate, breathing rate, and blood oxygen level will be monitored until the medicines you were given have worn off.  You will be given medicines as needed to control your pain.  Do not drive for 24 hours if you were given a sedative. This information is not intended to replace advice given to you by your health care provider. Make sure you discuss any questions you have with your health care provider. Document Revised: 10/22/2017 Document Reviewed: 04/27/2016 Elsevier Patient Education  2020 [employer].  

## 2020-04-18 NOTE — Progress Notes (Signed)
Patient ID: Christopher Booker, male   DOB: 12/08/1970, 49 y.o.   MRN: [id number]  Chief Complaint: Right upper quadrant/epigastric pain  History of Present Illness Norlan Rann is a 48 y.o. male with postprandial upper/right upper abdominal pain on and off for the last 3 years.  Multiple prior ultrasounds completed, no history of HIDA scan.  Most recent ultrasound confirmed presence of gallstones.  He has been advised to be careful what he is eating but still has lingering epigastric pain radiating to the back.  He has a positive history of nausea, loose bowel movements.  He denies fevers, chills and vomiting.  He was started on Cipro and Flagyl for 7-day course.  Past Medical History Past Medical History:  Diagnosis Date  . Allergy   . Chicken pox   . Diabetes mellitus without complication (HCC)   . Kidney stones       Past Surgical History:  Procedure Laterality Date  . NO PAST SURGERIES      No Known Allergies  Current Outpatient Medications  Medication Sig Dispense Refill  . amLODipine (NORVASC) 10 MG tablet Take by mouth.    . Ascorbic Acid (VITAMIN C) 1000 MG tablet Take 1,000 mg by mouth daily.    Marland Kitchen atorvastatin (LIPITOR) 20 MG tablet Take 20 mg by mouth daily.    . Bempedoic Acid-Ezetimibe (NEXLIZET) 180-10 MG TABS Take by mouth.    . cholecalciferol (VITAMIN D) 400 units TABS tablet Take 5,000 Units by mouth.    . dapagliflozin propanediol (FARXIGA) 5 MG TABS tablet Take by mouth.    . fexofenadine (ALLEGRA) 180 MG tablet Take 180 mg by mouth daily.    . fluticasone (FLONASE) 50 MCG/ACT nasal spray Place 1 spray into both nostrils daily.    Marland Kitchen glucosamine-chondroitin 500-400 MG tablet Take 2 tablets by mouth every morning.    . hydrochlorothiazide (HYDRODIURIL) 25 MG tablet Take 25 mg by mouth daily.    Marland Kitchen losartan (COZAAR) 25 MG tablet Take 50 mg by mouth daily.    . metFORMIN (GLUCOPHAGE) 500 MG tablet Take by mouth.    . montelukast (SINGULAIR) 10 MG tablet Take 10 mg by mouth every  morning.    . Multiple Vitamin (MULTI-VITAMINS) TABS Take 1 tablet by mouth daily.    Marland Kitchen omeprazole (PRILOSEC) 20 MG capsule Take 1 capsule by mouth daily as needed.    . pantoprazole (PROTONIX) 40 MG tablet TAKE 1 TABLET BY MOUTH DAILY. TAKE 15-20MINUTES BEFORE A MEAL    . potassium chloride (KLOR-CON) 10 MEQ tablet Take 10 mEq by mouth daily.     No current facility-administered medications for this visit.    Family History Family History  Problem Relation Age of Onset  . Prostate cancer Maternal Grandfather   . Arthritis Paternal Grandmother       Social History Social History   Tobacco Use  . Smoking status: Never Smoker  . Smokeless tobacco: Never Used  Substance Use Topics  . Alcohol use: Yes    Alcohol/week: 5.0 standard drinks    Types: 5 Standard drinks or equivalent per week  . Drug use: No        Review of Systems  Constitutional: Negative for chills, fever, malaise/fatigue and weight loss.  HENT: Negative for nosebleeds and sore throat.   Eyes: Negative for blurred vision and redness.  Respiratory: Negative for stridor.   Cardiovascular: Negative for chest pain and palpitations.  Musculoskeletal: Negative for joint pain and myalgias.  Skin: Negative for itching and  rash.  Neurological: Negative for dizziness, tingling, seizures and headaches.  Endo/Heme/Allergies: Negative.  Does not bruise/bleed easily.      Physical Exam Blood pressure 130/75, pulse 93, temperature 97.7 F (36.5 C), resp. rate 12, height 6' (1.829 m), weight 244 lb 6.4 oz (110.9 kg), SpO2 94 %. Last Weight  Most recent update: 04/18/2020 10:07 AM   Weight  110.9 kg (244 lb 6.4 oz)            CONSTITUTIONAL: Well developed, and nourished, appropriately responsive and aware without distress.   EYES: Sclera non-icteric.   EARS, NOSE, MOUTH AND THROAT: Mask worn.    Hearing is intact to voice.  NECK: Trachea is midline, and there is no jugular venous distension.  LYMPH NODES:  Lymph  nodes in the neck are not enlarged. RESPIRATORY:  Lungs are clear, and breath sounds are equal bilaterally. Normal respiratory effort without pathologic use of accessory muscles. CARDIOVASCULAR: Heart is regular in rate and rhythm. GI: The abdomen is soft, nontender, and nondistended. There were no palpable masses. I did not appreciate hepatosplenomegaly. There were normal bowel sounds. MUSCULOSKELETAL:  Symmetrical muscle tone appreciated in all four extremities.    SKIN: Skin turgor is normal. No pathologic skin lesions appreciated.  NEUROLOGIC:  Motor and sensation appear grossly normal.  Cranial nerves are grossly without defect. PSYCH:  Alert and oriented to person, place and time. Affect is appropriate for situation.  Data Reviewed I have personally reviewed what is currently available of the patient's imaging, recent labs and medical records.   Labs:  CBC Latest Ref Rng & Units 04/11/2018 04/10/2018 09/30/2016  WBC 3.8 - 10.6 K/uL 7.7 14.1(H) 8.2  Hemoglobin 13.0 - 18.0 g/dL 14.6 14.7 14.8  Hematocrit 40.0 - 52.0 % 43.0 43.0 42.8  Platelets 150 - 440 K/uL 277 338 361.0   CMP Latest Ref Rng & Units 04/10/2018 09/24/2016 06/17/2014  Glucose 65 - 99 mg/dL 151(H) 138(H) 105(H)  BUN 6 - 20 mg/dL 12 10 10   Creatinine 0.61 - 1.24 mg/dL 0.77 0.82 1.04  Sodium 135 - 145 mmol/L 133(L) 137 138  Potassium 3.5 - 5.1 mmol/L 3.7 4.6 3.6  Chloride 101 - 111 mmol/L 99(L) 102 104  CO2 22 - 32 mmol/L 24 26 29   Calcium 8.9 - 10.3 mg/dL 9.3 9.6 9.8  Total Protein 6.5 - 8.1 g/dL 7.5 7.2 8.0  Total Bilirubin 0.3 - 1.2 mg/dL 0.6 0.5 0.3  Alkaline Phos 38 - 126 U/L 73 69 90  AST 15 - 41 U/L 27 26 36  Gago 17 - 63 U/L 36 41 60      Imaging: Radiology review:  CLINICAL DATA:  Epigastric abdominal pain.  EXAM: ULTRASOUND ABDOMEN LIMITED RIGHT UPPER QUADRANT  COMPARISON:  Ultrasound 04/10/2018  FINDINGS: Gallbladder:  Echogenic shadowing gallstones are noted in the gallbladder. The largest  calculus measures 13.8 mm. 12 mm calculus in the gallbladder neck could be causing obstruction. There is moderate gallbladder wall thickening measuring up to 6 mm. The wall also appears somewhat edematous and inflamed. No definite pericholecystic fluid.  Common bile duct:  Diameter: 5.7 mm  Liver:  There is diffuse increased echogenicity of the liver and decreased through transmission consistent with fatty infiltration. No focal lesions or biliary dilatation. Portal vein is patent on color Doppler imaging with normal direction of blood flow towards the liver.  Other: None.  IMPRESSION: 1. Gallstones and gallbladder wall thickening suspicious for acute cholecystitis. 12 mm calculus noted in the gallbladder neck could  be causing obstruction. 2. Normal common bile duct. 3. Fatty infiltration of the liver.   Within last 24 hrs: No results found.  Assessment    Chronic calculus cholecystitis Patient Active Problem List   Diagnosis Date Noted  . Symptomatic cholelithiasis   . Abdominal pain 04/10/2018  . Screening examination for STD (sexually transmitted disease) 09/24/2016  . Encounter for preventative adult health care exam with abnormal findings 09/24/2016  . Psoriasis 09/24/2016  . Elevated BP without diagnosis of hypertension 09/24/2016    Plan    Robotic cholecystectomy.  This was discussed thoroughly.  Optimal plan is for robotic cholecystectomy.  Risks and benefits have been discussed with the patient which include but are not limited to anesthesia, bleeding, infection, biliary ductal injury or stenosis, other associated unanticipated injuries affiliated with laparoscopic surgery.  I believe there is the desire to proceed, interpreter utilized as needed.  Questions elicited and answered to satisfaction.  No guarantees ever expressed or implied.  Face-to-face time spent with the patient and accompanying care providers(if present) was 40 minutes, with more than  50% of the time spent counseling, educating, and coordinating care of the patient.      Campbell Lerner M.D., FACS 04/18/2020, 10:43 AM

## 2020-04-18 NOTE — H&P (View-Only) (Signed)
Patient ID: Christopher Booker, male   DOB: 04/12/1971, 48 y.o.   MRN: [id number]  Chief Complaint: Right upper quadrant/epigastric pain  History of Present Illness Christopher Booker is a 48 y.o. male with postprandial upper/right upper abdominal pain on and off for the last 3 years.  Multiple prior ultrasounds completed, no history of HIDA scan.  Most recent ultrasound confirmed presence of gallstones.  He has been advised to be careful what he is eating but still has lingering epigastric pain radiating to the back.  He has a positive history of nausea, loose bowel movements.  He denies fevers, chills and vomiting.  He was started on Cipro and Flagyl for 7-day course.  Past Medical History Past Medical History:  Diagnosis Date  . Allergy   . Chicken pox   . Diabetes mellitus without complication (HCC)   . Kidney stones       Past Surgical History:  Procedure Laterality Date  . NO PAST SURGERIES      No Known Allergies  Current Outpatient Medications  Medication Sig Dispense Refill  . amLODipine (NORVASC) 10 MG tablet Take by mouth.    . Ascorbic Acid (VITAMIN C) 1000 MG tablet Take 1,000 mg by mouth daily.    . atorvastatin (LIPITOR) 20 MG tablet Take 20 mg by mouth daily.    . Bempedoic Acid-Ezetimibe (NEXLIZET) 180-10 MG TABS Take by mouth.    . cholecalciferol (VITAMIN D) 400 units TABS tablet Take 5,000 Units by mouth.    . dapagliflozin propanediol (FARXIGA) 5 MG TABS tablet Take by mouth.    . fexofenadine (ALLEGRA) 180 MG tablet Take 180 mg by mouth daily.    . fluticasone (FLONASE) 50 MCG/ACT nasal spray Place 1 spray into both nostrils daily.    . glucosamine-chondroitin 500-400 MG tablet Take 2 tablets by mouth every morning.    . hydrochlorothiazide (HYDRODIURIL) 25 MG tablet Take 25 mg by mouth daily.    . losartan (COZAAR) 25 MG tablet Take 50 mg by mouth daily.    . metFORMIN (GLUCOPHAGE) 500 MG tablet Take by mouth.    . montelukast (SINGULAIR) 10 MG tablet Take 10 mg by mouth every  morning.    . Multiple Vitamin (MULTI-VITAMINS) TABS Take 1 tablet by mouth daily.    . omeprazole (PRILOSEC) 20 MG capsule Take 1 capsule by mouth daily as needed.    . pantoprazole (PROTONIX) 40 MG tablet TAKE 1 TABLET BY MOUTH DAILY. TAKE 15-20MINUTES BEFORE A MEAL    . potassium chloride (KLOR-CON) 10 MEQ tablet Take 10 mEq by mouth daily.     No current facility-administered medications for this visit.    Family History Family History  Problem Relation Age of Onset  . Prostate cancer Maternal Grandfather   . Arthritis Paternal Grandmother       Social History Social History   Tobacco Use  . Smoking status: Never Smoker  . Smokeless tobacco: Never Used  Substance Use Topics  . Alcohol use: Yes    Alcohol/week: 5.0 standard drinks    Types: 5 Standard drinks or equivalent per week  . Drug use: No        Review of Systems  Constitutional: Negative for chills, fever, malaise/fatigue and weight loss.  HENT: Negative for nosebleeds and sore throat.   Eyes: Negative for blurred vision and redness.  Respiratory: Negative for stridor.   Cardiovascular: Negative for chest pain and palpitations.  Musculoskeletal: Negative for joint pain and myalgias.  Skin: Negative for itching and   rash.  Neurological: Negative for dizziness, tingling, seizures and headaches.  Endo/Heme/Allergies: Negative.  Does not bruise/bleed easily.      Physical Exam Blood pressure 130/75, pulse 93, temperature 97.7 F (36.5 C), resp. rate 12, height 6' (1.829 m), weight 244 lb 6.4 oz (110.9 kg), SpO2 94 %. Last Weight  Most recent update: 04/18/2020 10:07 AM   Weight  110.9 kg (244 lb 6.4 oz)            CONSTITUTIONAL: Well developed, and nourished, appropriately responsive and aware without distress.   EYES: Sclera non-icteric.   EARS, NOSE, MOUTH AND THROAT: Mask worn.    Hearing is intact to voice.  NECK: Trachea is midline, and there is no jugular venous distension.  LYMPH NODES:  Lymph  nodes in the neck are not enlarged. RESPIRATORY:  Lungs are clear, and breath sounds are equal bilaterally. Normal respiratory effort without pathologic use of accessory muscles. CARDIOVASCULAR: Heart is regular in rate and rhythm. GI: The abdomen is soft, nontender, and nondistended. There were no palpable masses. I did not appreciate hepatosplenomegaly. There were normal bowel sounds. MUSCULOSKELETAL:  Symmetrical muscle tone appreciated in all four extremities.    SKIN: Skin turgor is normal. No pathologic skin lesions appreciated.  NEUROLOGIC:  Motor and sensation appear grossly normal.  Cranial nerves are grossly without defect. PSYCH:  Alert and oriented to person, place and time. Affect is appropriate for situation.  Data Reviewed I have personally reviewed what is currently available of the patient's imaging, recent labs and medical records.   Labs:  CBC Latest Ref Rng & Units 04/11/2018 04/10/2018 09/30/2016  WBC 3.8 - 10.6 K/uL 7.7 14.1(H) 8.2  Hemoglobin 13.0 - 18.0 g/dL 14.6 14.7 14.8  Hematocrit 40.0 - 52.0 % 43.0 43.0 42.8  Platelets 150 - 440 K/uL 277 338 361.0   CMP Latest Ref Rng & Units 04/10/2018 09/24/2016 06/17/2014  Glucose 65 - 99 mg/dL 151(H) 138(H) 105(H)  BUN 6 - 20 mg/dL 12 10 10   Creatinine 0.61 - 1.24 mg/dL 0.77 0.82 1.04  Sodium 135 - 145 mmol/L 133(L) 137 138  Potassium 3.5 - 5.1 mmol/L 3.7 4.6 3.6  Chloride 101 - 111 mmol/L 99(L) 102 104  CO2 22 - 32 mmol/L 24 26 29   Calcium 8.9 - 10.3 mg/dL 9.3 9.6 9.8  Total Protein 6.5 - 8.1 g/dL 7.5 7.2 8.0  Total Bilirubin 0.3 - 1.2 mg/dL 0.6 0.5 0.3  Alkaline Phos 38 - 126 U/L 73 69 90  AST 15 - 41 U/L 27 26 36  Gago 17 - 63 U/L 36 41 60      Imaging: Radiology review:  CLINICAL DATA:  Epigastric abdominal pain.  EXAM: ULTRASOUND ABDOMEN LIMITED RIGHT UPPER QUADRANT  COMPARISON:  Ultrasound 04/10/2018  FINDINGS: Gallbladder:  Echogenic shadowing gallstones are noted in the gallbladder. The largest  calculus measures 13.8 mm. 12 mm calculus in the gallbladder neck could be causing obstruction. There is moderate gallbladder wall thickening measuring up to 6 mm. The wall also appears somewhat edematous and inflamed. No definite pericholecystic fluid.  Common bile duct:  Diameter: 5.7 mm  Liver:  There is diffuse increased echogenicity of the liver and decreased through transmission consistent with fatty infiltration. No focal lesions or biliary dilatation. Portal vein is patent on color Doppler imaging with normal direction of blood flow towards the liver.  Other: None.  IMPRESSION: 1. Gallstones and gallbladder wall thickening suspicious for acute cholecystitis. 12 mm calculus noted in the gallbladder neck could  be causing obstruction. 2. Normal common bile duct. 3. Fatty infiltration of the liver.   Within last 24 hrs: No results found.  Assessment    Chronic calculus cholecystitis Patient Active Problem List   Diagnosis Date Noted  . Symptomatic cholelithiasis   . Abdominal pain 04/10/2018  . Screening examination for STD (sexually transmitted disease) 09/24/2016  . Encounter for preventative adult health care exam with abnormal findings 09/24/2016  . Psoriasis 09/24/2016  . Elevated BP without diagnosis of hypertension 09/24/2016    Plan    Robotic cholecystectomy.  This was discussed thoroughly.  Optimal plan is for robotic cholecystectomy.  Risks and benefits have been discussed with the patient which include but are not limited to anesthesia, bleeding, infection, biliary ductal injury or stenosis, other associated unanticipated injuries affiliated with laparoscopic surgery.  I believe there is the desire to proceed, interpreter utilized as needed.  Questions elicited and answered to satisfaction.  No guarantees ever expressed or implied.  Face-to-face time spent with the patient and accompanying care providers(if present) was 40 minutes, with more than  50% of the time spent counseling, educating, and coordinating care of the patient.      Campbell Lerner M.D., FACS 04/18/2020, 10:43 AM

## 2020-04-18 NOTE — Telephone Encounter (Signed)
Pt has been advised of Pre-Admission date/time, COVID Testing date and Surgery date.  Surgery Date: 04/24/20 Preadmission Testing Date: Patient to arrive 2 hours early per Morrie Sheldon with PreAdmit due to the holiday.    Covid Testing Date: 04/19/20 - patient [facility] [facility]) between 8a-1p   Patient has been made aware to call [phone], between 1-3:00pm the day before surgery, to find out what time to arrive for surgery.

## 2020-04-19 ENCOUNTER — Other Ambulatory Visit: Payer: Self-pay

## 2020-04-19 ENCOUNTER — Other Ambulatory Visit
Admission: RE | Admit: 2020-04-19 | Discharge: 2020-04-19 | Disposition: A | Discharge status: Home / Self Care | Payer: 59 | Source: Ambulatory Visit | Attending: Surgery | Admitting: Surgery

## 2020-04-19 DIAGNOSIS — Z20822 Contact with and (suspected) exposure to covid-19: Secondary | ICD-10-CM | POA: Diagnosis not present

## 2020-04-19 DIAGNOSIS — Z01812 Encounter for preprocedural laboratory examination: Secondary | ICD-10-CM | POA: Insufficient documentation

## 2020-04-19 LAB — SARS CORONAVIRUS 2 (TAT 6-24 HRS): SARS Coronavirus 2: NEGATIVE

## 2020-04-23 ENCOUNTER — Telehealth: Payer: Self-pay | Admitting: *Deleted

## 2020-04-23 NOTE — Telephone Encounter (Signed)
Patient called and is having surgery tomorrow with Dr Claudine Mouton, he stated that he was supposed to stop taking metformin on Sunday but forgot and took it yesterday. He wants to know if that's okay or does he have to reschedule his surgery?

## 2020-04-23 NOTE — Telephone Encounter (Signed)
Per Dr.Rodenberg informed patient he would be fine to proceed with scheduled surgery tomorrow as Metformin is not a medication that should interfere with his surgery. Patient verbalized understanding and has more questions about surgery and was transferred to speak with surgery scheduler Britta Mccreedy.

## 2020-04-24 ENCOUNTER — Encounter
Admission: RE | Disposition: A | Discharge status: Home / Self Care | Payer: Self-pay | Source: Home / Self Care | Attending: Surgery

## 2020-04-24 ENCOUNTER — Ambulatory Visit
Admission: RE | Admit: 2020-04-24 | Discharge: 2020-04-24 | Disposition: A | Discharge status: Home / Self Care | Payer: 59 | Attending: Surgery | Admitting: Surgery

## 2020-04-24 ENCOUNTER — Other Ambulatory Visit: Payer: Self-pay

## 2020-04-24 ENCOUNTER — Ambulatory Visit: Payer: 59 | Admitting: Anesthesiology

## 2020-04-24 ENCOUNTER — Encounter: Payer: Self-pay | Admitting: Surgery

## 2020-04-24 DIAGNOSIS — Z79899 Other long term (current) drug therapy: Secondary | ICD-10-CM | POA: Insufficient documentation

## 2020-04-24 DIAGNOSIS — E119 Type 2 diabetes mellitus without complications: Secondary | ICD-10-CM | POA: Diagnosis not present

## 2020-04-24 DIAGNOSIS — L409 Psoriasis, unspecified: Secondary | ICD-10-CM | POA: Insufficient documentation

## 2020-04-24 DIAGNOSIS — Z7984 Long term (current) use of oral hypoglycemic drugs: Secondary | ICD-10-CM | POA: Diagnosis not present

## 2020-04-24 DIAGNOSIS — K8010 Calculus of gallbladder with chronic cholecystitis without obstruction: Secondary | ICD-10-CM | POA: Insufficient documentation

## 2020-04-24 DIAGNOSIS — K760 Fatty (change of) liver, not elsewhere classified: Secondary | ICD-10-CM | POA: Insufficient documentation

## 2020-04-24 LAB — COMPREHENSIVE METABOLIC PANEL
ALT: 66 U/L — ABNORMAL HIGH (ref 0–44)
AST: 34 U/L (ref 15–41)
Albumin: 4.5 g/dL (ref 3.5–5.0)
Alkaline Phosphatase: 40 U/L (ref 38–126)
Anion gap: 11 (ref 5–15)
BUN: 17 mg/dL (ref 6–20)
CO2: 25 mmol/L (ref 22–32)
Calcium: 9.4 mg/dL (ref 8.9–10.3)
Chloride: 104 mmol/L (ref 98–111)
Creatinine, Ser: 0.8 mg/dL (ref 0.61–1.24)
GFR calc Af Amer: >60 mL/min (ref >60)
GFR calc non Af Amer: >60 mL/min (ref >60)
Glucose, Bld: 120 mg/dL — ABNORMAL HIGH (ref 70–99)
Potassium: 3.6 mmol/L (ref 3.5–5.1)
Sodium: 140 mmol/L (ref 135–145)
Total Bilirubin: 0.7 mg/dL (ref 0.3–1.2)
Total Protein: 7.1 g/dL (ref 6.5–8.1)

## 2020-04-24 LAB — CBC WITH DIFFERENTIAL/PLATELET
Abs Immature Granulocytes: 0.03 10*3/uL (ref 0.00–0.07)
Basophils Absolute: 0.1 10*3/uL (ref 0.0–0.1)
Basophils Relative: 1 %
Eosinophils Absolute: 0.2 10*3/uL (ref 0.0–0.5)
Eosinophils Relative: 3 %
HCT: 42.6 % (ref 39.0–52.0)
Hemoglobin: 14.7 g/dL (ref 13.0–17.0)
Immature Granulocytes: 0 %
Lymphocytes Relative: 29 %
Lymphs Abs: 2.3 10*3/uL (ref 0.7–4.0)
MCH: 30.1 pg (ref 26.0–34.0)
MCHC: 34.5 g/dL (ref 30.0–36.0)
MCV: 87.3 fL (ref 80.0–100.0)
Monocytes Absolute: 0.7 10*3/uL (ref 0.1–1.0)
Monocytes Relative: 9 %
Neutro Abs: 4.7 10*3/uL (ref 1.7–7.7)
Neutrophils Relative %: 58 %
Platelets: 357 10*3/uL (ref 150–400)
RBC: 4.88 MIL/uL (ref 4.22–5.81)
RDW: 13.1 % (ref 11.5–15.5)
WBC: 8 10*3/uL (ref 4.0–10.5)
nRBC: 0 % (ref 0.0–0.2)

## 2020-04-24 LAB — GLUCOSE, CAPILLARY
Glucose-Capillary: 114 mg/dL — ABNORMAL HIGH (ref 70–99)
Glucose-Capillary: 138 mg/dL — ABNORMAL HIGH (ref 70–99)

## 2020-04-24 SURGERY — CHOLECYSTECTOMY, ROBOT-ASSISTED, LAPAROSCOPIC
Anesthesia: General

## 2020-04-24 MED ORDER — PHENYLEPHRINE HCL (PRESSORS) 10 MG/ML IV SOLN
INTRAVENOUS | Status: AC
  Filled 2020-04-24: qty 1

## 2020-04-24 MED ORDER — PROPOFOL 10 MG/ML IV BOLUS
INTRAVENOUS | Status: AC
  Filled 2020-04-24: qty 40

## 2020-04-24 MED ORDER — FENTANYL CITRATE (PF) 100 MCG/2ML IJ SOLN
25.0000 ug | INTRAMUSCULAR | Status: DC | PRN
  Administered 2020-04-24: 25 ug via INTRAVENOUS

## 2020-04-24 MED ORDER — ONDANSETRON HCL 4 MG/2ML IJ SOLN
INTRAMUSCULAR | Status: DC | PRN
  Administered 2020-04-24: 4 mg via INTRAVENOUS

## 2020-04-24 MED ORDER — MIDAZOLAM HCL 2 MG/2ML IJ SOLN
INTRAMUSCULAR | Status: AC
  Filled 2020-04-24: qty 2

## 2020-04-24 MED ORDER — SUGAMMADEX SODIUM 200 MG/2ML IV SOLN
INTRAVENOUS | Status: DC | PRN
  Administered 2020-04-24: 200 mg via INTRAVENOUS

## 2020-04-24 MED ORDER — PROPOFOL 10 MG/ML IV BOLUS
INTRAVENOUS | Status: DC | PRN
  Administered 2020-04-24: 160 mg via INTRAVENOUS

## 2020-04-24 MED ORDER — ONDANSETRON HCL 4 MG/2ML IJ SOLN: 4.0000 mg | Freq: Once | INTRAMUSCULAR | Status: DC | PRN

## 2020-04-24 MED ORDER — CELECOXIB 200 MG PO CAPS
ORAL_CAPSULE | ORAL | Status: AC
  Administered 2020-04-24: 200 mg via ORAL
  Filled 2020-04-24: qty 1

## 2020-04-24 MED ORDER — INDOCYANINE GREEN 25 MG IV SOLR
2.5000 mg | Freq: Once | INTRAVENOUS | Status: AC
  Administered 2020-04-24: 2.5 mg via INTRAVENOUS
  Filled 2020-04-24: qty 10

## 2020-04-24 MED ORDER — CHLORHEXIDINE GLUCONATE CLOTH 2 % EX PADS: 6.0000 | MEDICATED_PAD | Freq: Once | CUTANEOUS | Status: DC

## 2020-04-24 MED ORDER — GABAPENTIN 300 MG PO CAPS
ORAL_CAPSULE | ORAL | Status: AC
  Administered 2020-04-24: 300 mg via ORAL
  Filled 2020-04-24: qty 1

## 2020-04-24 MED ORDER — MIDAZOLAM HCL 2 MG/2ML IJ SOLN
INTRAMUSCULAR | Status: DC | PRN
  Administered 2020-04-24: 2 mg via INTRAVENOUS

## 2020-04-24 MED ORDER — BUPIVACAINE-EPINEPHRINE (PF) 0.25% -1:200000 IJ SOLN
INTRAMUSCULAR | Status: DC | PRN
  Administered 2020-04-24: 30 mL

## 2020-04-24 MED ORDER — KETOROLAC TROMETHAMINE 30 MG/ML IJ SOLN
INTRAMUSCULAR | Status: DC | PRN
  Administered 2020-04-24: 15 mg via INTRAVENOUS

## 2020-04-24 MED ORDER — BUPIVACAINE LIPOSOME 1.3 % IJ SUSP: 20.0000 mL | Freq: Once | INTRAMUSCULAR | Status: DC

## 2020-04-24 MED ORDER — LIDOCAINE HCL (CARDIAC) PF 100 MG/5ML IV SOSY
PREFILLED_SYRINGE | INTRAVENOUS | Status: DC | PRN
  Administered 2020-04-24: 100 mg via INTRAVENOUS

## 2020-04-24 MED ORDER — ACETAMINOPHEN 500 MG PO TABS
ORAL_TABLET | ORAL | Status: AC
  Administered 2020-04-24: 1000 mg via ORAL
  Filled 2020-04-24: qty 2

## 2020-04-24 MED ORDER — FAMOTIDINE 20 MG PO TABS: 20.0000 mg | ORAL_TABLET | Freq: Once | ORAL | Status: DC

## 2020-04-24 MED ORDER — SODIUM CHLORIDE 0.9 % IV SOLN: INTRAVENOUS | Status: DC

## 2020-04-24 MED ORDER — ROCURONIUM BROMIDE 100 MG/10ML IV SOLN
INTRAVENOUS | Status: DC | PRN
  Administered 2020-04-24: 50 mg via INTRAVENOUS
  Administered 2020-04-24: 10 mg via INTRAVENOUS

## 2020-04-24 MED ORDER — IBUPROFEN 800 MG PO TABS: 800.0000 mg | ORAL_TABLET | Freq: Three times a day (TID) | ORAL | 0 refills | Status: DC | PRN

## 2020-04-24 MED ORDER — CHLORHEXIDINE GLUCONATE 0.12 % MT SOLN
OROMUCOSAL | Status: AC
  Administered 2020-04-24: 15 mL via OROMUCOSAL
  Filled 2020-04-24: qty 15

## 2020-04-24 MED ORDER — GABAPENTIN 300 MG PO CAPS: 300.0000 mg | ORAL_CAPSULE | ORAL | Status: AC

## 2020-04-24 MED ORDER — DEXAMETHASONE SODIUM PHOSPHATE 10 MG/ML IJ SOLN
INTRAMUSCULAR | Status: AC
  Filled 2020-04-24: qty 1

## 2020-04-24 MED ORDER — CHLORHEXIDINE GLUCONATE 0.12 % MT SOLN: 15.0000 mL | Freq: Once | OROMUCOSAL | Status: AC

## 2020-04-24 MED ORDER — SUCCINYLCHOLINE CHLORIDE 200 MG/10ML IV SOSY
PREFILLED_SYRINGE | INTRAVENOUS | Status: AC
  Filled 2020-04-24: qty 10

## 2020-04-24 MED ORDER — EPHEDRINE 5 MG/ML INJ
INTRAVENOUS | Status: AC
  Filled 2020-04-24: qty 10

## 2020-04-24 MED ORDER — DEXAMETHASONE SODIUM PHOSPHATE 10 MG/ML IJ SOLN
INTRAMUSCULAR | Status: DC | PRN
  Administered 2020-04-24: 5 mg via INTRAVENOUS

## 2020-04-24 MED ORDER — CEFAZOLIN SODIUM-DEXTROSE 2-4 GM/100ML-% IV SOLN
2.0000 g | INTRAVENOUS | Status: AC
  Administered 2020-04-24: 2 g via INTRAVENOUS

## 2020-04-24 MED ORDER — ACETAMINOPHEN 500 MG PO TABS: 1000.0000 mg | ORAL_TABLET | ORAL | Status: AC

## 2020-04-24 MED ORDER — BUPIVACAINE LIPOSOME 1.3 % IJ SUSP
INTRAMUSCULAR | Status: AC
  Filled 2020-04-24: qty 20

## 2020-04-24 MED ORDER — CEFAZOLIN SODIUM-DEXTROSE 2-4 GM/100ML-% IV SOLN
INTRAVENOUS | Status: AC
  Filled 2020-04-24: qty 100

## 2020-04-24 MED ORDER — FENTANYL CITRATE (PF) 100 MCG/2ML IJ SOLN
INTRAMUSCULAR | Status: DC | PRN
  Administered 2020-04-24 (×2): 50 ug via INTRAVENOUS
  Administered 2020-04-24: 25 ug via INTRAVENOUS

## 2020-04-24 MED ORDER — LACTATED RINGERS IV SOLN: INTRAVENOUS | Status: DC | PRN

## 2020-04-24 MED ORDER — ORAL CARE MOUTH RINSE: 15.0000 mL | Freq: Once | OROMUCOSAL | Status: AC

## 2020-04-24 MED ORDER — ROCURONIUM BROMIDE 10 MG/ML (PF) SYRINGE
PREFILLED_SYRINGE | INTRAVENOUS | Status: AC
  Filled 2020-04-24: qty 10

## 2020-04-24 MED ORDER — LIDOCAINE HCL (PF) 2 % IJ SOLN
INTRAMUSCULAR | Status: AC
  Filled 2020-04-24: qty 5

## 2020-04-24 MED ORDER — HYDROCODONE-ACETAMINOPHEN 5-325 MG PO TABS: 1.0000 | ORAL_TABLET | Freq: Four times a day (QID) | ORAL | 0 refills | Status: DC | PRN

## 2020-04-24 MED ORDER — CELECOXIB 200 MG PO CAPS: 200.0000 mg | ORAL_CAPSULE | ORAL | Status: AC

## 2020-04-24 MED ORDER — BUPIVACAINE HCL (PF) 0.25 % IJ SOLN
INTRAMUSCULAR | Status: AC
  Filled 2020-04-24: qty 30

## 2020-04-24 MED ORDER — FENTANYL CITRATE (PF) 100 MCG/2ML IJ SOLN
INTRAMUSCULAR | Status: AC
  Filled 2020-04-24: qty 2

## 2020-04-24 MED ORDER — KETOROLAC TROMETHAMINE 30 MG/ML IJ SOLN
INTRAMUSCULAR | Status: AC
  Filled 2020-04-24: qty 1

## 2020-04-24 MED ORDER — ONDANSETRON HCL 4 MG/2ML IJ SOLN
INTRAMUSCULAR | Status: AC
  Filled 2020-04-24: qty 2

## 2020-04-24 MED ORDER — EPINEPHRINE PF 1 MG/ML IJ SOLN
INTRAMUSCULAR | Status: AC
  Filled 2020-04-24: qty 1

## 2020-04-24 SURGICAL SUPPLY — 38 items
CANISTER SUCT 1200ML W/VALVE (MISCELLANEOUS) ×3 IMPLANT
CHLORAPREP W/TINT 26 (MISCELLANEOUS) ×3 IMPLANT
CLIP VESOLOCK MED LG 6/CT (CLIP) ×3 IMPLANT
COVER TIP SHEARS 8 DVNC (MISCELLANEOUS) ×2 IMPLANT
COVER TIP SHEARS 8MM DA VINCI (MISCELLANEOUS) ×1
COVER WAND RF STERILE (DRAPES) ×3 IMPLANT
DECANTER SPIKE VIAL GLASS SM (MISCELLANEOUS) ×3 IMPLANT
DEFOGGER SCOPE WARMER CLEARIFY (MISCELLANEOUS) ×3 IMPLANT
DERMABOND ADVANCED (GAUZE/BANDAGES/DRESSINGS) ×1
DERMABOND ADVANCED .7 DNX12 (GAUZE/BANDAGES/DRESSINGS) ×2 IMPLANT
DRAPE ARM DVNC X/XI (DISPOSABLE) ×8 IMPLANT
DRAPE COLUMN DVNC XI (DISPOSABLE) ×2 IMPLANT
DRAPE DA VINCI XI ARM (DISPOSABLE) ×4
DRAPE DA VINCI XI COLUMN (DISPOSABLE) ×1
GLOVE ORTHO TXT STRL SZ7.5 (GLOVE) ×8 IMPLANT
GOWN STRL REUS W/ TWL LRG LVL3 (GOWN DISPOSABLE) ×8 IMPLANT
GOWN STRL REUS W/TWL LRG LVL3 (GOWN DISPOSABLE) ×4
GRASPER SUT TROCAR 14GX15 (MISCELLANEOUS) IMPLANT
IRRIGATION STRYKERFLOW (MISCELLANEOUS) IMPLANT
IRRIGATOR STRYKERFLOW (MISCELLANEOUS)
IV NS IRRIG 3000ML ARTHROMATIC (IV SOLUTION) IMPLANT
KIT PINK PAD W/HEAD ARE REST (MISCELLANEOUS) ×3
KIT PINK PAD W/HEAD ARM REST (MISCELLANEOUS) ×2 IMPLANT
KIT TURNOVER KIT A (KITS) ×3 IMPLANT
LABEL OR SOLS (LABEL) ×3 IMPLANT
NDL INSUFFLATION 14GA 120MM (NEEDLE) IMPLANT
NEEDLE HYPO 22GX1.5 SAFETY (NEEDLE) ×3 IMPLANT
NEEDLE INSUFFLATION 14GA 120MM (NEEDLE) IMPLANT
NS IRRIG 500ML POUR BTL (IV SOLUTION) ×3 IMPLANT
PACK LAP CHOLECYSTECTOMY (MISCELLANEOUS) ×3 IMPLANT
POUCH SPECIMEN RETRIEVAL 10MM (ENDOMECHANICALS) ×3 IMPLANT
SEAL CANN UNIV 5-8 DVNC XI (MISCELLANEOUS) ×8 IMPLANT
SEAL XI 5MM-8MM UNIVERSAL (MISCELLANEOUS) ×4
SET TUBE SMOKE EVAC HIGH FLOW (TUBING) ×3 IMPLANT
SOLUTION ELECTROLUBE (MISCELLANEOUS) ×3 IMPLANT
SUT MNCRL AB 4-0 PS2 18 (SUTURE) ×3 IMPLANT
SUT VICRYL 0 AB UR-6 (SUTURE) ×3 IMPLANT
TROCAR Z-THREAD FIOS 11X100 BL (TROCAR) ×3 IMPLANT

## 2020-04-24 NOTE — Anesthesia Postprocedure Evaluation (Signed)
Anesthesia Post Note  Patient: Nicolai Achor  Procedure(s) Performed: XI ROBOTIC ASSISTED LAPAROSCOPIC CHOLECYSTECTOMY (N/A ) INDOCYANINE GREEN FLUORESCENCE IMAGING (ICG)  Patient location during evaluation: PACU Anesthesia Type: General Level of consciousness: awake and alert Pain management: pain level controlled Vital Signs Assessment: post-procedure vital signs reviewed and stable Respiratory status: spontaneous breathing and respiratory function stable Cardiovascular status: stable Anesthetic complications: no     Last Vitals:  Vitals:   04/24/20 1007 04/24/20 1009  BP:  124/74  Pulse: 77 85  Resp: 15 11  Temp: 36.6 C   SpO2: 99% 98%    Last Pain:  Vitals:   04/24/20 1007  TempSrc:   PainSc: 0-No pain                 Damaria Vachon K

## 2020-04-24 NOTE — Transfer of Care (Signed)
Immediate Anesthesia Transfer of Care Note  Patient: Christopher Booker  Procedure(s) Performed: XI ROBOTIC ASSISTED LAPAROSCOPIC CHOLECYSTECTOMY (N/A ) INDOCYANINE GREEN FLUORESCENCE IMAGING (ICG)  Patient Location: PACU  Anesthesia Type:General  Level of Consciousness: awake  Airway & Oxygen Therapy: Patient Spontanous Breathing  Post-op Assessment: Report given to RN  Post vital signs: stable  Last Vitals:  Vitals Value Taken Time  BP 124/74 04/24/20 1009  Temp 36.6 C 04/24/20 1007  Pulse 77 04/24/20 1012  Resp 13 04/24/20 1012  SpO2 97 % 04/24/20 1012  Vitals shown include unvalidated device data.  Last Pain:  Vitals:   04/24/20 1007  TempSrc:   PainSc: 0-No pain         Complications: No apparent anesthesia complications

## 2020-04-24 NOTE — Discharge Instructions (Signed)

## 2020-04-24 NOTE — Interval H&P Note (Signed)
History and Physical Interval Note:  04/24/2020 8:20 AM  Christopher Booker  has presented today for surgery, with the diagnosis of Cholelithiasis.  The various methods of treatment have been discussed with the patient and family. After consideration of risks, benefits and other options for treatment, the patient has consented to  Procedure(s): XI ROBOTIC ASSISTED LAPAROSCOPIC CHOLECYSTECTOMY (N/A) as a surgical intervention.  The patient's history has been reviewed, patient examined, no change in status, stable for surgery.  I have reviewed the patient's chart and labs.  Questions were answered to the patient's satisfaction.     Campbell Lerner, M.D., [facility]  04/24/2020 ; 8:20 AM

## 2020-04-24 NOTE — Anesthesia Preprocedure Evaluation (Signed)
Anesthesia Evaluation  Patient identified by MRN, date of birth, ID band Patient awake    Reviewed: Allergy & Precautions, NPO status , Patient's Chart, lab work & pertinent test results  History of Anesthesia Complications Negative for: history of anesthetic complications  Airway Mallampati: III       Dental   Pulmonary neg sleep apnea, neg COPD, Not current smoker,           Cardiovascular (-) hypertension(-) Past MI and (-) CHF (-) dysrhythmias (-) Valvular Problems/Murmurs     Neuro/Psych neg Seizures    GI/Hepatic Neg liver ROS, neg GERD  ,  Endo/Other  diabetes, Type 2, Oral Hypoglycemic Agents  Renal/GU Renal disease (stones)     Musculoskeletal   Abdominal   Peds  Hematology   Anesthesia Other Findings   Reproductive/Obstetrics                             Anesthesia Physical Anesthesia Plan  ASA: II  Anesthesia Plan: General   Post-op Pain Management:    Induction: Intravenous  PONV Risk Score and Plan: 2 and Ondansetron and Dexamethasone  Airway Management Planned: Oral ETT  Additional Equipment:   Intra-op Plan:   Post-operative Plan:   Informed Consent: I have reviewed the patients History and Physical, chart, labs and discussed the procedure including the risks, benefits and alternatives for the proposed anesthesia with the patient or authorized representative who has indicated his/her understanding and acceptance.       Plan Discussed with:   Anesthesia Plan Comments:         Anesthesia Quick Evaluation

## 2020-04-24 NOTE — Anesthesia Procedure Notes (Signed)
Procedure Name: Intubation Date/Time: 04/24/2020 8:44 AM Performed by: Zetta Bills, CRNA Pre-anesthesia Checklist: Patient identified, Emergency Drugs available, Suction available and Patient being monitored Patient Re-evaluated:Patient Re-evaluated prior to induction Oxygen Delivery Method: Circle system utilized Preoxygenation: Pre-oxygenation with 100% oxygen Induction Type: IV induction Ventilation: Mask ventilation without difficulty Laryngoscope Size: Mac and 4 Grade View: Grade II Tube type: Oral Tube size: 7.0 mm Number of attempts: 1 Airway Equipment and Method: Stylet Placement Confirmation: ETT inserted through vocal cords under direct vision Secured at: 22 cm Tube secured with: Tape Dental Injury: Teeth and Oropharynx as per pre-operative assessment

## 2020-04-24 NOTE — Op Note (Signed)
Robotic cholecystectomy  Pre-operative Diagnosis: Chronic calculus cholecystitis  Post-operative Diagnosis:  Same.  Procedure: Robotic assisted laparoscopic cholecystectomy.  Surgeon: Campbell Lerner, M.D., FACS  Anesthesia: General. with endotracheal tube  Findings: As expected, no remarkable additional adhesions.  Estimated Blood Loss: 5 mL         Drains: None         Specimens: Gallbladder           Complications: none  Procedure Details  The patient was seen again in the Holding Room.  1.25 mg dose of ICG was administered intravenously.  The benefits, complications, treatment options, risks and expected outcomes were discussed with the patient. The likelihood of improving the patient's symptoms with return to their baseline status is good.  The patient and/or family concurred with the proposed plan, giving informed consent, again alternatives reviewed.  The patient was taken to Operating Room, identified, and the procedure verified as robotic assisted laparoscopic cholecystectomy.  Prior to the induction of general anesthesia, antibiotic prophylaxis was administered. VTE prophylaxis was in place. General endotracheal anesthesia was then administered and tolerated well. The patient was positioned in the supine position.  After the induction, the abdomen was prepped with Chloraprep and draped in the sterile fashion.  A Time Out was held and the above information confirmed.  Right infra-umbilical local infiltration with quarter percent Marcaine with epinephrine is utilized.  Made a 12 mm incision on the left periumbilical site, I advanced an optical 76mm port under direct visualization into the peritoneal cavity.  Once the peritoneum was penetrated, insufflation was initiated.  The trocar was then advanced into the abdominal cavity under direct visualization. Pneumoperitoneum was then continued with CO2 at 14 mmHg or less and tolerated well without any adverse changes in the patient's  vital signs.  Two 8.5-mm ports were placed in the left lower quadrant and laterally, and one to the right lower quadrant, all under direct vision. All skin incisions  were infiltrated with a local anesthetic agent before making the incision and placing the trocars.  The patient was positioned  in reverse Trendelenburg, tilted the patient's left side down.  Da Vinci XI robot was then positioned on to the patient's left side, and docked.  The gallbladder was identified, the fundus grasped via the arm 4 Prograsp and retracted cephalad. Adhesions were lysed with scissors and cautery.  The infundibulum was identified grasped and retracted laterally, exposing the peritoneum overlying the triangle of Calot. This was then opened and dissected using cautery & scissors. An extended critical view of the cystic duct and cystic artery was obtained, aided by the ICG via FireFly which enabled ready visualization of the ductal anatomy.    The cystic duct was clearly identified and dissected to isolation.   Artery well isolated and clipped, and the cystic duct was triple clipped and divided with scissors, leaving two on the remaining stump.  A single clip was applied to the cystic artery, the gallbladder side was sealed with the bipolar and then divided.  The gallbladder was taken from the gallbladder fossa in a retrograde fashion with the electrocautery. The gallbladder was removed and placed in an Endocatch bag.  Hemostasis was confirmed with the electrocautery.  The robot was undocked and moved away from the operative field. The gallbladder and Endocatch sac were then removed through the infraumbilical port site.   Inspection of the right upper quadrant was performed. No bleeding, bile duct injury or leak, or bowel injury was noted. The infra-umbilical port site  fascia was closed with interrumpted 0 Vicryl sutures using PMI/cone under direct visualization. Pneumoperitoneum was released and ports removed.  4-0  subcuticular Monocryl was used to close the skin. Dermabond was  applied.  The patient was then extubated and brought to the recovery room in stable condition. Sponge, lap, and needle counts were correct at closure and at the conclusion of the case.               Ronny Bacon, M.D., FACS 04/24/2020 10:00 AM

## 2020-04-25 LAB — SURGICAL PATHOLOGY

## 2020-05-07 ENCOUNTER — Other Ambulatory Visit: Payer: Self-pay

## 2020-05-07 ENCOUNTER — Encounter: Payer: Self-pay | Admitting: Physician Assistant

## 2020-05-07 ENCOUNTER — Ambulatory Visit (INDEPENDENT_AMBULATORY_CARE_PROVIDER_SITE_OTHER): Payer: 59 | Admitting: Physician Assistant

## 2020-05-07 VITALS — BP 131/65 | HR 91 | Temp 97.5°F | Resp 12 | Ht 72.0 in | Wt 241.0 lb

## 2020-05-07 DIAGNOSIS — K8010 Calculus of gallbladder with chronic cholecystitis without obstruction: Secondary | ICD-10-CM

## 2020-05-07 DIAGNOSIS — Z09 Encounter for follow-up examination after completed treatment for conditions other than malignant neoplasm: Secondary | ICD-10-CM

## 2020-05-07 MED ORDER — AMOXICILLIN-POT CLAVULANATE 875-125 MG PO TABS: 1.0000 | ORAL_TABLET | Freq: Two times a day (BID) | ORAL | 0 refills | Status: AC

## 2020-05-07 NOTE — Progress Notes (Signed)
[facility] POST-OP OFFICE VISIT  05/07/2020  HPI: Christopher Booker is a 49 y.o. male 13 days s/p robotic assisted laparoscopic cholecystectomy for [facility] with Dr Claudine Mouton  He reports that he is doing well. Intermittently with right sided abdominal pina, only taking Tylenol/Motrin for this. Not using narcotics. No fever, chills, nausea or emesis. He does fluctuate between diarrhea and constipation which is new. Unsure if related to anything specific he eats. Incisions are healing well but he is reporting erythema surrounding them and is unsure if this is related to the dermabond. No other issues  Vital signs: BP 131/65   Pulse 91   Temp (!) 97.5 F (36.4 C) (Oral)   Resp 12   Ht 6' (1.829 m)   Wt 241 lb (109.3 kg)   SpO2 94%   BMI 32.69 kg/m    Physical Exam: Constitutional: Well appearing male, NAD Abdomen: Soft, non-tender, non-distended, no rebound/guarding Skin: Laparoscopic incisions are CDI, there is surrounding blanching erythema to the incisions worse on the right lateral port site, no drainage, no fluctuance or crepitus.   Assessment/Plan: This is a 49 y.o. male 13 days s/p robotic assisted laparoscopic cholecystectomy for [facility]   - Although I think likely he had an irritive/allergic reaction to the dermabond, I will send him in 1 week of Augmentin BID as a precaution  - Encouraged continuing Tylenol/Motrin for pain as needed  - Monitor diet/diarrhea  - Reviewed lifting restrictions  - Reviewed pathology  - rtc prn; discussed return precautions or reasons to call  -- Lynden Oxford, PA-C [facility] 05/07/2020, 9:20 AM [phone] M-F: 7am - 4pm

## 2020-05-07 NOTE — Patient Instructions (Addendum)
Please pick up your medication at the pharmacy. Increase your water intake to see if this helps with the leg cramps. Call our office if you have any questions or concerns.    GENERAL POST-OPERATIVE PATIENT INSTRUCTIONS   WOUND CARE INSTRUCTIONS:  Keep a dry clean dressing on the wound if there is drainage. The initial bandage may be removed after 24 hours.  Once the wound has quit draining you may leave it open to air.  If clothing rubs against the wound or causes irritation and the wound is not draining you may cover it with a dry dressing during the daytime.  Try to keep the wound dry and avoid ointments on the wound unless directed to do so.  If the wound becomes bright red and painful or starts to drain infected material that is not clear, please contact your physician immediately.  If the wound is mildly pink and has a thick firm ridge underneath it, this is normal, and is referred to as a healing ridge.  This will resolve over the next 4-6 weeks.  BATHING: You may shower if you have been informed of this by your surgeon. However, Please do not submerge in a tub, hot tub, or pool until incisions are completely sealed or have been told by your surgeon that you may do so.  DIET:  You may eat any foods that you can tolerate.  It is a good idea to eat a high fiber diet and take in plenty of fluids to prevent constipation.  If you do become constipated you may want to take a mild laxative or take ducolax tablets on a daily basis until your bowel habits are regular.  Constipation can be very uncomfortable, along with straining, after recent surgery.  ACTIVITY:  You are encouraged to cough and deep breath or use your incentive spirometer if you were given one, every 15-30 minutes when awake.  This will help prevent respiratory complications and low grade fevers post-operatively if you had a general anesthetic.  You may want to hug a pillow when coughing and sneezing to add additional support to the  surgical area, if you had abdominal or chest surgery, which will decrease pain during these times.  You are encouraged to walk and engage in light activity for the next two weeks.  You should not lift more than 20 pounds, until 05/21/20 as it could put you at increased risk for complications.  Twenty pounds is roughly equivalent to a plastic bag of groceries. At that time- Listen to your body when lifting, if you have pain when lifting, stop and then try again in a few days. Soreness after doing exercises or activities of daily living is normal as you get back in to your normal routine.  MEDICATIONS:  Try to take narcotic medications and anti-inflammatory medications, such as tylenol, ibuprofen, naprosyn, etc., with food.  This will minimize stomach upset from the medication.  Should you develop nausea and vomiting from the pain medication, or develop a rash, please discontinue the medication and contact your physician.  You should not drive, make important decisions, or operate machinery when taking narcotic pain medication.  SUNBLOCK Use sun block to incision area over the next year if this area will be exposed to sun. This helps decrease scarring and will allow you avoid a permanent darkened area over your incision.  QUESTIONS:  Please feel free to call our office if you have any questions, and we will be glad to assist you. [phone]

## 2020-05-16 ENCOUNTER — Other Ambulatory Visit: Payer: Self-pay | Admitting: Surgery

## 2021-08-30 IMAGING — US US ABDOMEN LIMITED
1 series · 14 of 25 positions shown · non-contrast
Comparison: Ultrasound 04/10/2018

CLINICAL DATA: Epigastric abdominal pain.

EXAM:
ULTRASOUND ABDOMEN LIMITED RIGHT UPPER QUADRANT

[Series 1: us abdomen limited ruq · 14 of 51 slices shown]
[im 1/51]
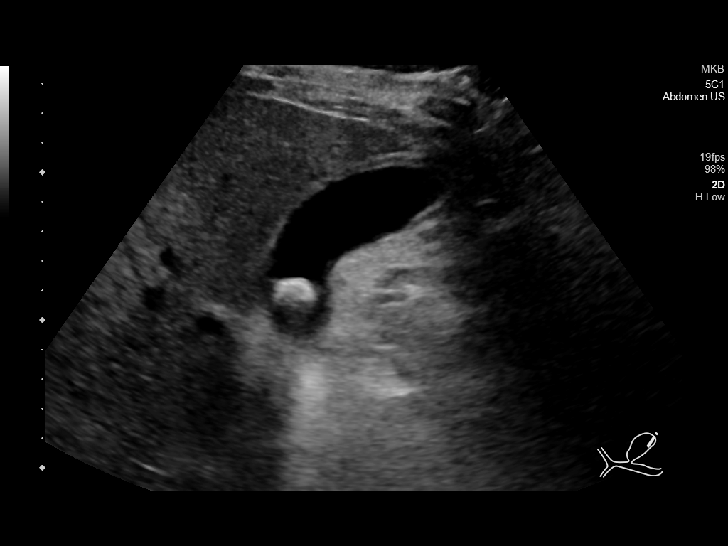
[im 5/51]
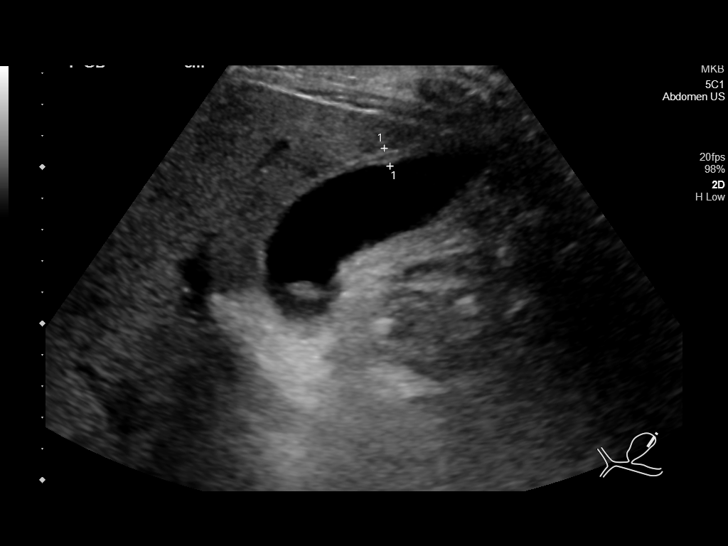
[im 9/51]
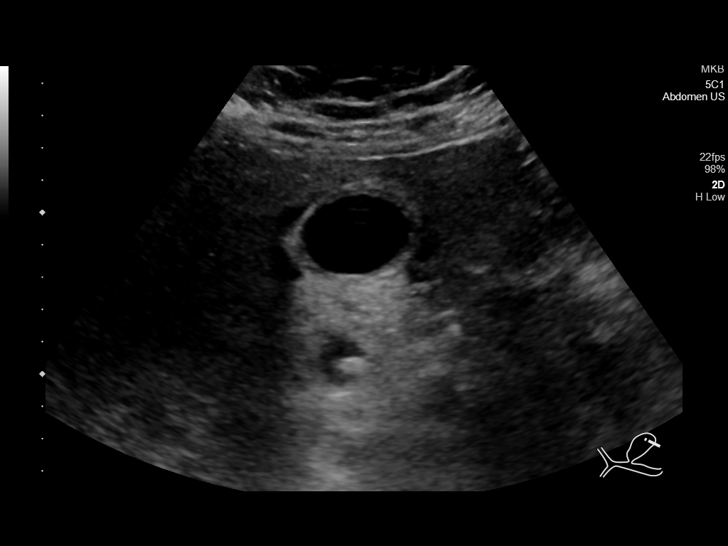
[im 13/51]
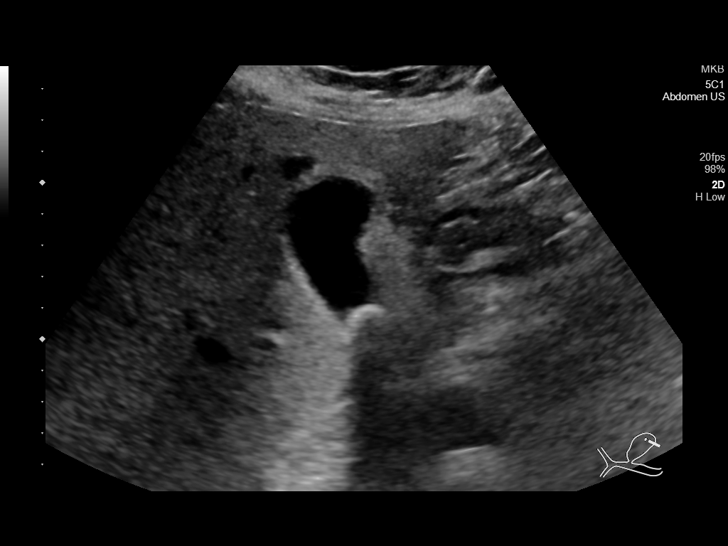
[im 17/51]
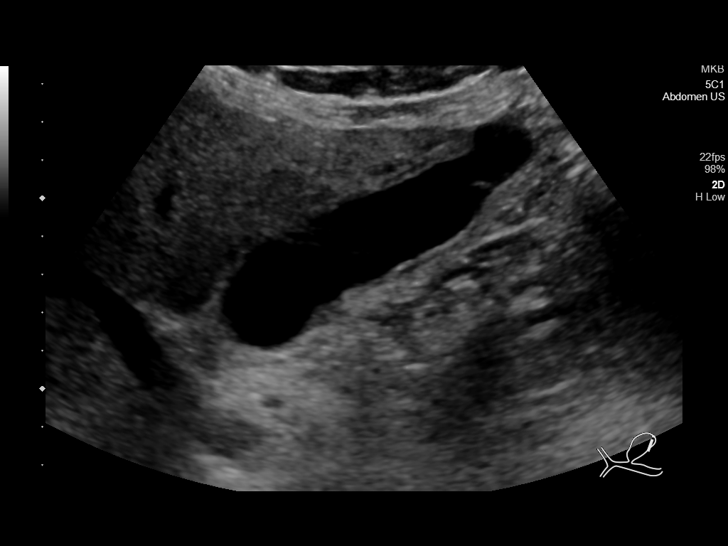
[im 19/51]
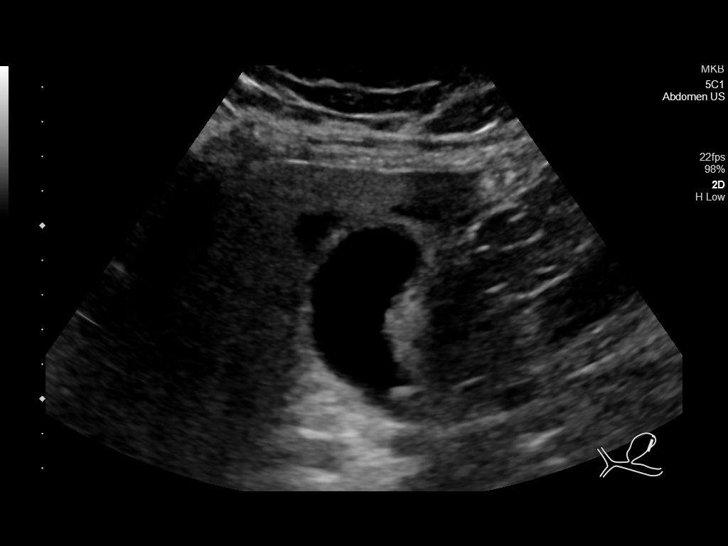
[im 23/51]
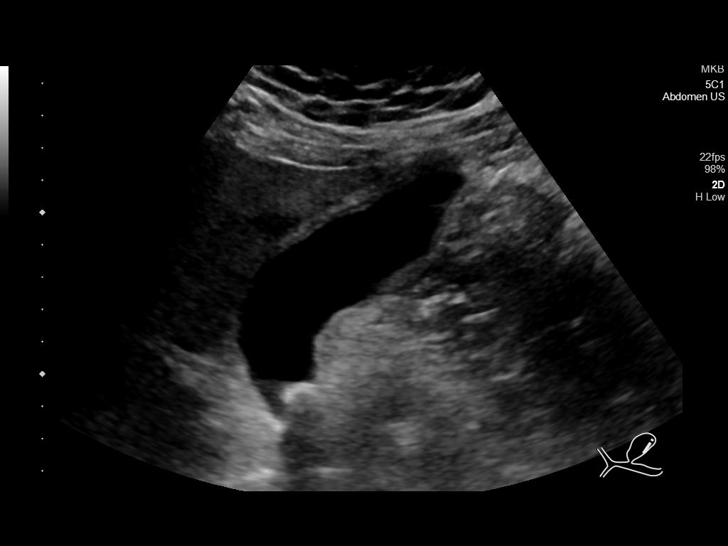
[im 28/51]
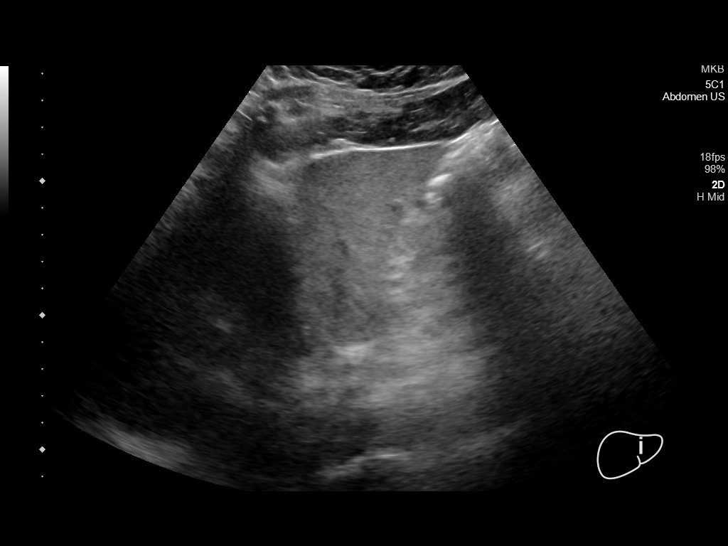
[im 32/51]
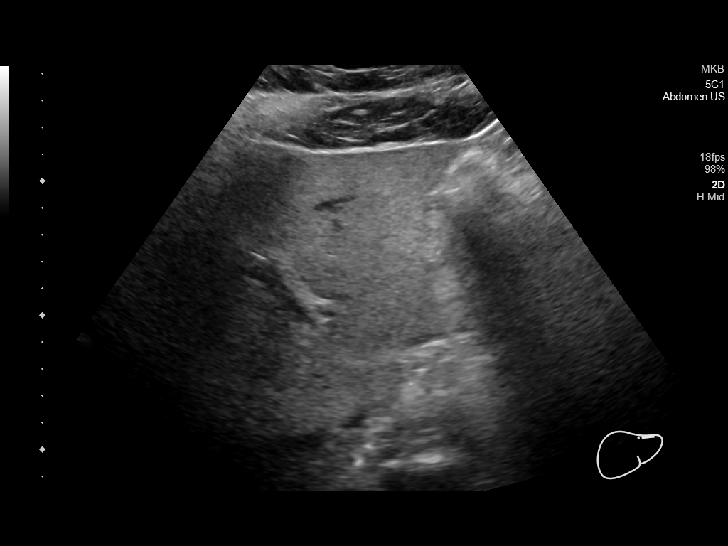
[im 34/51]
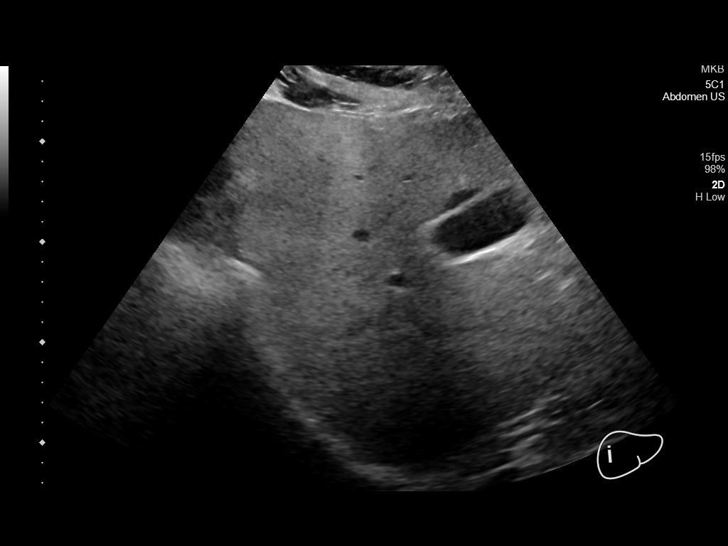
[im 38/51]
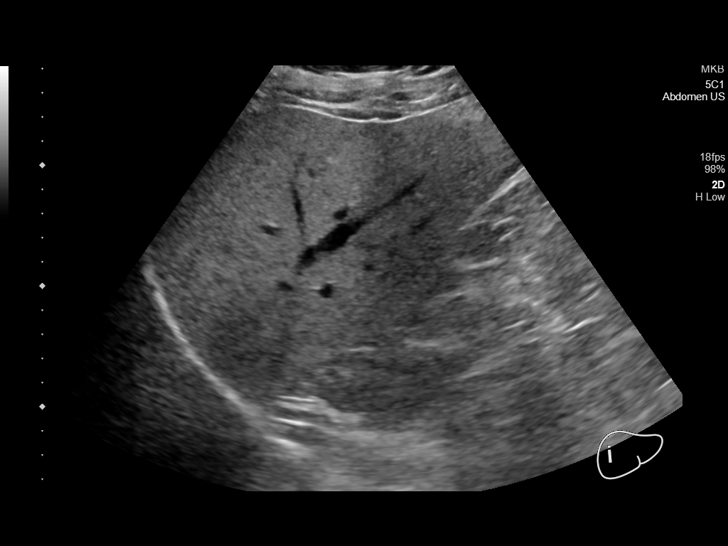
[im 42/51]
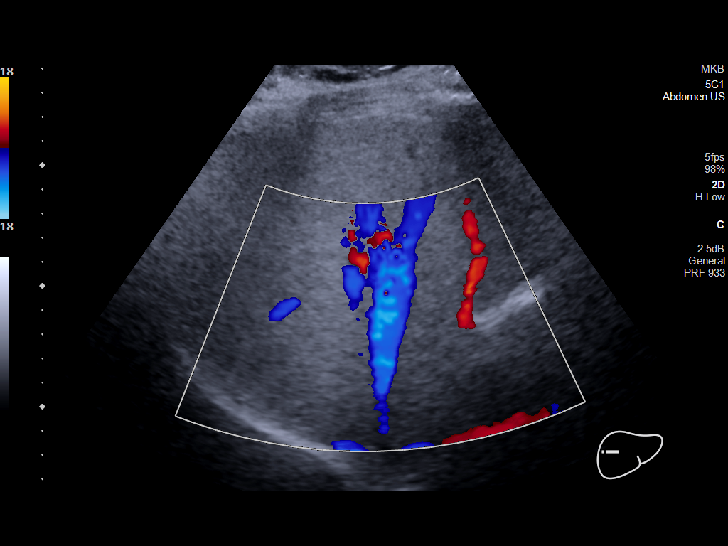
[im 46/51]
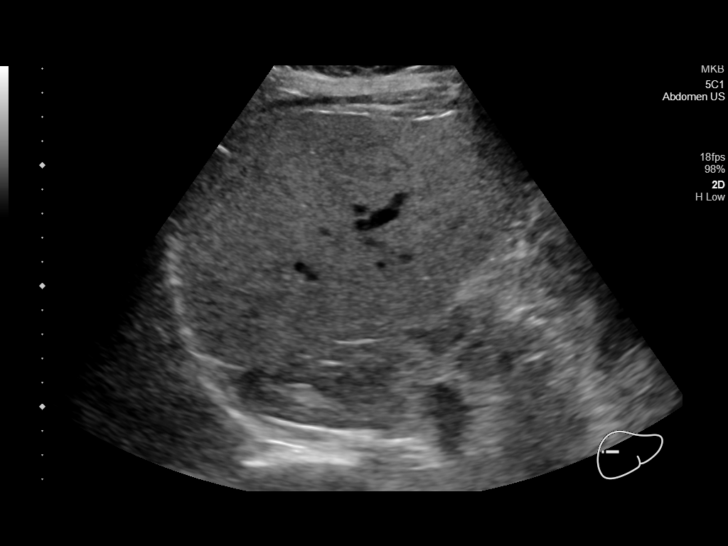
[im 51/51]
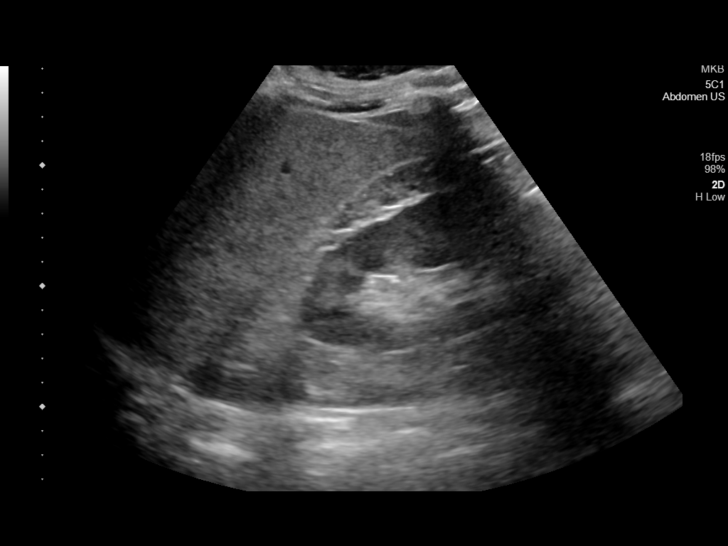

[14 of 25 positions shown; findings below may reference images not displayed]

FINDINGS: Gallbladder:

Echogenic shadowing gallstones are noted in the gallbladder. The
largest calculus measures 13.8 mm. 12 mm calculus in the gallbladder
neck could be causing obstruction. There is moderate gallbladder
wall thickening measuring up to 6 mm. The wall also appears somewhat
edematous and inflamed. No definite pericholecystic fluid.

Common bile duct:

Diameter: 5.7 mm

Liver:

There is diffuse increased echogenicity of the liver and decreased
through transmission consistent with fatty infiltration. No focal
lesions or biliary dilatation. Portal vein is patent on color
Doppler imaging with normal direction of blood flow towards the
liver.

Other: None.
IMPRESSION: 1. Gallstones and gallbladder wall thickening suspicious for acute
cholecystitis. 12 mm calculus noted in the gallbladder neck could be
causing obstruction.
2. Normal common bile duct.
3. Fatty infiltration of the liver.

## 2023-03-31 ENCOUNTER — Telehealth: Payer: Self-pay | Admitting: *Deleted

## 2023-03-31 NOTE — Telephone Encounter (Signed)
I scheduled him an appointment with Dr. Al Corpus on 04/14/2023 at 9 am.

## 2023-04-13 ENCOUNTER — Other Ambulatory Visit: Payer: Self-pay

## 2023-04-13 ENCOUNTER — Other Ambulatory Visit (HOSPITAL_COMMUNITY): Payer: Self-pay

## 2023-04-13 MED ORDER — TRULICITY 3 MG/0.5ML ~~LOC~~ SOAJ
3.0000 mg | SUBCUTANEOUS | 4 refills | Status: AC
  Filled 2023-04-13: qty 2, 28d supply, fill #0
  Filled 2023-05-03 – 2023-05-06 (×2): qty 2, 28d supply, fill #1
  Filled 2023-06-02: qty 2, 28d supply, fill #2
  Filled 2023-07-27: qty 2, 28d supply, fill #3

## 2023-04-14 ENCOUNTER — Encounter: Payer: Self-pay | Admitting: Podiatry

## 2023-04-14 ENCOUNTER — Ambulatory Visit (INDEPENDENT_AMBULATORY_CARE_PROVIDER_SITE_OTHER): Payer: 59

## 2023-04-14 ENCOUNTER — Ambulatory Visit: Payer: 59 | Admitting: Podiatry

## 2023-04-14 DIAGNOSIS — M722 Plantar fascial fibromatosis: Secondary | ICD-10-CM | POA: Diagnosis not present

## 2023-04-14 DIAGNOSIS — M7752 Other enthesopathy of left foot: Secondary | ICD-10-CM

## 2023-04-14 MED ORDER — TRIAMCINOLONE ACETONIDE 40 MG/ML IJ SUSP
20.0000 mg | Freq: Once | INTRAMUSCULAR | Status: AC
  Administered 2023-04-14: 20 mg

## 2023-04-14 MED ORDER — MELOXICAM 15 MG PO TABS: 15.0000 mg | ORAL_TABLET | Freq: Every day | ORAL | 3 refills | Status: AC

## 2023-04-14 MED ORDER — METHYLPREDNISOLONE 4 MG PO TBPK: ORAL_TABLET | ORAL | 0 refills | Status: DC

## 2023-04-14 NOTE — Patient Instructions (Signed)

## 2023-04-14 NOTE — Progress Notes (Signed)
Subjective:  Patient ID: Christopher Booker, male    DOB: 08-10-1971,  MRN: [id number] HPI Chief Complaint  Patient presents with   Foot Pain    Plantar heel left - aching x 2 months, AM pain, PCP eval-thought plantar fasciitis and referred here, tried Biofreeze and aspirin   New Patient (Initial Visit)    52 y.o. male presents with the above complaint.   ROS: Denies fever chills nausea vomit muscle aches pains calf pain back pain chest pain shortness of breath  Past Medical History:  Diagnosis Date   Allergy    Chicken pox    Diabetes mellitus without complication (HCC)    Kidney stones    Past Surgical History:  Procedure Laterality Date   CHOLECYSTECTOMY     NO PAST SURGERIES      Current Outpatient Medications:    amLODipine (NORVASC) 5 MG tablet, Take 5 mg by mouth daily., Disp: , Rfl:    Ascorbic Acid (VITAMIN C) 1000 MG tablet, Take 1,000 mg by mouth daily., Disp: , Rfl:    Bempedoic Acid-Ezetimibe (NEXLIZET) 180-10 MG TABS, Take 1 tablet by mouth daily. , Disp: , Rfl:    Cholecalciferol 125 MCG (5000 UT) TABS, Take 5,000 Units by mouth. , Disp: , Rfl:    dapagliflozin propanediol (FARXIGA) 5 MG TABS tablet, Take 5 mg by mouth daily. , Disp: , Rfl:    Dulaglutide (TRULICITY) 3 MG/0.5ML SOPN, Inject 3 mg into the skin once a week., Disp: 2 mL, Rfl: 4   fluticasone (FLONASE) 50 MCG/ACT nasal spray, Place 1 spray into both nostrils daily., Disp: , Rfl:    glucosamine-chondroitin 500-400 MG tablet, Take 2 tablets by mouth every morning., Disp: , Rfl:    JARDIANCE 25 MG TABS tablet, Take 25 mg by mouth daily., Disp: , Rfl:    losartan (COZAAR) 50 MG tablet, Take 100 mg by mouth daily. , Disp: , Rfl:    meloxicam (MOBIC) 15 MG tablet, Take 1 tablet (15 mg total) by mouth daily., Disp: 30 tablet, Rfl: 3   methylPREDNISolone (MEDROL DOSEPAK) 4 MG TBPK tablet, 6 day dose pack - take as directed, Disp: 21 tablet, Rfl: 0   montelukast (SINGULAIR) 10 MG tablet, Take 10 mg by mouth every  morning., Disp: , Rfl:    omeprazole (PRILOSEC) 40 MG capsule, Take 40 mg by mouth daily., Disp: , Rfl:    ONETOUCH VERIO test strip, 1 each 2 (two) times daily., Disp: , Rfl:    potassium chloride (KLOR-CON) 10 MEQ tablet, Take 10 mEq by mouth daily., Disp: , Rfl:   No Known Allergies Review of Systems Objective:  There were no vitals filed for this visit.  General: Well developed, nourished, in no acute distress, alert and oriented x3   Dermatological: Skin is warm, dry and supple bilateral. Nails x 10 are well maintained; remaining integument appears unremarkable at this time. There are no open sores, no preulcerative lesions, no rash or signs of infection present.  Vascular: Dorsalis Pedis artery and Posterior Tibial artery pedal pulses are 2/4 bilateral with immedate capillary fill time. Pedal hair growth present. No varicosities and no lower extremity edema present bilateral.   Neruologic: Grossly intact via light touch bilateral. Vibratory intact via tuning fork bilateral. Protective threshold with Semmes Wienstein monofilament intact to all pedal sites bilateral. Patellar and Achilles deep tendon reflexes 2+ bilateral. No Babinski or clonus noted bilateral.   Musculoskeletal: No gross boney pedal deformities bilateral. No pain, crepitus, or limitation noted with foot  and ankle range of motion bilateral. Muscular strength 5/5 in all groups tested bilateral.  He has pain on palpation MucoClear tubercle of his left heel no pain on the lateral compression of the calcaneus.  Gait: Unassisted, Nonantalgic.    Radiographs:  Radiographs taken today demonstrate osseously mature foot with good mineralization soft tissue increase in density demonstrating a thickening of the plantar fascia at its insertions point.  Assessment & Plan:   Assessment: Planter fasciitis.  Plan: Started him on methylprednisolone to be followed by meloxicam.  Injected his left heel today 20 mg Kenalog milligrams  Marcaine point maximal tenderness.  Also started him with a plantar fascial brace.  Discussed appropriate shoe gear stretching exercise ice therapy sugar modifications.  Follow-up with him in 1 month if necessary     Shelsie Tijerino T. [place], [place]

## 2023-05-03 ENCOUNTER — Other Ambulatory Visit (HOSPITAL_COMMUNITY): Payer: Self-pay

## 2023-05-04 ENCOUNTER — Ambulatory Visit (INDEPENDENT_AMBULATORY_CARE_PROVIDER_SITE_OTHER): Payer: 59

## 2023-05-04 ENCOUNTER — Other Ambulatory Visit (HOSPITAL_COMMUNITY): Payer: Self-pay

## 2023-05-04 ENCOUNTER — Ambulatory Visit (HOSPITAL_COMMUNITY)
Admission: RE | Admit: 2023-05-04 | Discharge: 2023-05-04 | Disposition: A | Discharge status: Home / Self Care | Payer: 59 | Source: Ambulatory Visit | Attending: Family Medicine | Admitting: Family Medicine

## 2023-05-04 ENCOUNTER — Encounter (HOSPITAL_COMMUNITY): Payer: Self-pay

## 2023-05-04 VITALS — BP 136/71 | HR 100 | Temp 99.2°F | Resp 18

## 2023-05-04 DIAGNOSIS — J4521 Mild intermittent asthma with (acute) exacerbation: Secondary | ICD-10-CM | POA: Insufficient documentation

## 2023-05-04 DIAGNOSIS — Z113 Encounter for screening for infections with a predominantly sexual mode of transmission: Secondary | ICD-10-CM | POA: Insufficient documentation

## 2023-05-04 DIAGNOSIS — Z1152 Encounter for screening for COVID-19: Secondary | ICD-10-CM | POA: Diagnosis not present

## 2023-05-04 DIAGNOSIS — R0789 Other chest pain: Secondary | ICD-10-CM | POA: Insufficient documentation

## 2023-05-04 DIAGNOSIS — J069 Acute upper respiratory infection, unspecified: Secondary | ICD-10-CM | POA: Insufficient documentation

## 2023-05-04 DIAGNOSIS — B9789 Other viral agents as the cause of diseases classified elsewhere: Secondary | ICD-10-CM | POA: Insufficient documentation

## 2023-05-04 MED ORDER — PREDNISONE 20 MG PO TABS
40.0000 mg | ORAL_TABLET | Freq: Every day | ORAL | 0 refills | Status: AC
  Filled 2023-05-04: qty 6, 3d supply, fill #0

## 2023-05-04 MED ORDER — ALBUTEROL SULFATE HFA 108 (90 BASE) MCG/ACT IN AERS
2.0000 | INHALATION_SPRAY | RESPIRATORY_TRACT | 0 refills | Status: AC | PRN
  Filled 2023-05-04: qty 6.7, 25d supply, fill #0

## 2023-05-04 MED ORDER — BENZONATATE 100 MG PO CAPS
100.0000 mg | ORAL_CAPSULE | Freq: Three times a day (TID) | ORAL | 0 refills | Status: AC | PRN
  Filled 2023-05-04: qty 21, 7d supply, fill #0

## 2023-05-04 NOTE — ED Triage Notes (Signed)
Pt had nasal congestion and sinus headaches since Thursday. Reports taking Mucinex. Reports that congestion now settling in his chest.

## 2023-05-04 NOTE — Discharge Instructions (Signed)
My review, your chest x-ray is clear and is unchanged from what I can see in 2019.  Albuterol inhaler--do 2 puffs every 4 hours as needed for shortness of breath or wheezing  Take prednisone 20 mg--2 daily for 3 days  Take benzonatate 100 mg, 1 tab every 8 hours as needed for cough.   You have been swabbed for COVID, and the test will result in the next 24 hours. Our staff will call you if positive. If the COVID test is positive, you should quarantine until you are fever free for 24 hours and you are starting to feel better, and then take added precautions for the next 5 days, such as physical distancing/wearing a mask and good hand hygiene/washing.

## 2023-05-04 NOTE — ED Provider Notes (Signed)
[facility]    CSN: [id number] Arrival date & time: 05/04/23  1347      History   Chief Complaint Chief Complaint  Patient presents with   Nasal Congestion    congestion has now gone to my chest also - Entered by patient    HPI Christopher Booker is a 52 y.o. male.   HPI Here for nasal congestion and cough.  Symptoms began on June 6.  Now in the last 24 hours he feels that the congestion is more in his chest and his chest has been tight.  He has heard rattling or wheeze when he is breathing out.  He has not had any fever at home and no vomiting or diarrhea.  He states he has never been diagnosed with asthma or bronchitis.  He does have type 2 diabetes that is very well-controlled.  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 Authorizing Provider  albuterol (VENTOLIN HFA) 108 (90 Base) MCG/ACT inhaler Inhale 2 puffs into the lungs every 4 (four) hours as needed for wheezing or shortness of breath. 05/04/23  Yes Raena Pau, Janace Aris, MD  benzonatate (TESSALON) 100 MG capsule Take 1 capsule (100 mg total) by mouth 3 (three) times daily as needed for cough. 05/04/23  Yes Zenia Resides, MD  predniSONE (DELTASONE)  20 MG tablet Take 2 tablets (40 mg total) by mouth daily with breakfast for 3 days. 05/04/23 05/07/23 Yes Colin Ellers, Janace Aris, MD  amLODipine (NORVASC) 5 MG tablet Take 5 mg by mouth daily. 05/01/20   [provider]  Ascorbic Acid (VITAMIN C) 1000 MG tablet Take 1,000 mg by mouth daily.    [provider]  Bempedoic Acid-Ezetimibe (NEXLIZET) 180-10 MG TABS Take 1 tablet by mouth daily.  11/25/19   [provider]  Cholecalciferol 125 MCG (5000 UT) TABS Take 5,000 Units by mouth.     [provider]  dapagliflozin propanediol (FARXIGA) 5 MG TABS tablet Take 5 mg by mouth daily.  11/25/19   [provider]  Dulaglutide (TRULICITY) 3 MG/0.5ML SOPN Inject 3 mg into the skin once a week. 09/10/22     fluticasone (FLONASE) 50 MCG/ACT nasal spray Place 1 spray into both nostrils daily.    [provider]  glucosamine-chondroitin 500-400 MG tablet Take 2 tablets by mouth every morning.    [provider]  JARDIANCE 25 MG TABS tablet Take 25 mg by mouth daily. 03/18/23   [provider]  losartan (COZAAR) 50 MG tablet Take 100 mg by mouth daily.     [provider]  meloxicam (MOBIC) 15 MG tablet Take 1 tablet (15 mg total)  by mouth daily. 04/14/23   Hyatt, Max T, DPM  montelukast (SINGULAIR) 10 MG tablet Take 10 mg by mouth every morning.    [provider]  omeprazole (PRILOSEC) 40 MG capsule Take 40 mg by mouth daily. 03/18/23   [provider]  [facility] VERIO test strip 1 each 2 (two) times daily. 10/30/22   [provider]  potassium chloride (KLOR-CON) 10 MEQ tablet Take 10 mEq by mouth daily. 04/02/20   [provider]    Family History Family History  Problem Relation Age of Onset   Prostate cancer Maternal Grandfather    Arthritis Paternal Grandmother     Social History Social History   Tobacco Use   Smoking status: Never   Smokeless tobacco: Never  Substance Use Topics   Alcohol  use: Yes    Alcohol/week: 5.0 standard drinks of alcohol    Types: 5 Standard drinks or equivalent per week   Drug use: No     Allergies   Patient has no known allergies.   Review of Systems Review of Systems   Physical Exam Triage Vital Signs ED Triage Vitals [05/04/23 1413]  Enc Vitals Group     BP 136/71     Pulse Rate 100     Resp 18     Temp 99.2 F (37.3 C)     Temp Source Oral     SpO2 93 %     Weight      Height      Head Circumference      Peak Flow      Pain Score 8     Pain Loc      Pain Edu?      Excl. in GC?    No data found.  Updated Vital Signs BP 136/71 (BP Location: Right Arm)   Pulse 100   Temp 99.2 F (37.3 C) (Oral)   Resp 18   SpO2 93%   Visual Acuity Right Eye Distance:   Left Eye Distance:   Bilateral Distance:    Right Eye Near:   Left Eye Near:    Bilateral Near:     Physical Exam Vitals reviewed.  Constitutional:      General: He is not in acute distress.    Appearance: He is not ill-appearing, toxic-appearing or diaphoretic.  HENT:     Right Ear: Tympanic membrane and ear canal normal.     Left Ear: Tympanic membrane and ear canal normal.     Nose: Congestion present.     Mouth/Throat:     Mouth: Mucous membranes are moist.     Pharynx: No oropharyngeal exudate or posterior oropharyngeal erythema.  Eyes:     Extraocular Movements: Extraocular movements intact.     Conjunctiva/sclera: Conjunctivae normal.     Pupils: Pupils are equal, round, and reactive to light.  Cardiovascular:     Rate and Rhythm: Normal rate and regular rhythm.     Heart sounds: No murmur heard. Pulmonary:     Effort: No respiratory distress.     Breath sounds: No stridor. No wheezing, rhonchi or rales.  Musculoskeletal:     Cervical back: Neck supple.  Lymphadenopathy:     Cervical: No cervical adenopathy.  Skin:    Capillary Refill: Capillary refill takes less than 2 seconds.     Coloration: Skin is not jaundiced or pale.   Neurological:     General: No focal deficit present.     Mental Status: He is alert and  oriented to person, place, and time.  Psychiatric:        Behavior: Behavior normal.      UC Treatments / Results  Labs (all labs ordered are listed, but only abnormal results are displayed) Labs Reviewed  SARS CORONAVIRUS 2 (TAT 6-24 HRS)    EKG   Radiology No results found.  Procedures Procedures (including critical care time)  Medications Ordered in UC Medications - No data to display  Initial Impression / Assessment and Plan / UC Course  I have reviewed the triage vital signs and the nursing notes.  Pertinent labs & imaging results that were available during my care of the patient were reviewed by me and considered in my medical decision making (see chart for details).        On my review the chest x-ray is clear and stable compared to the previous one from 2019.  With a history of wheezing, going to treat for asthma exacerbation with albuterol and 3 days of prednisone.  Tessalon Perles are sent in for cough.  We get a go ahead and do a COVID swab today that this is day 5.  It will not result probably till the next 24 hours, but I think it is important for him to know if that is going on with his having diabetes. Final Clinical Impressions(s) / UC Diagnoses   Final diagnoses:  Viral upper respiratory tract infection  Mild intermittent asthma with acute exacerbation     Discharge Instructions      My review, your chest x-ray is clear and is unchanged from what I can see in 2019.  Albuterol inhaler--do 2 puffs every 4 hours as needed for shortness of breath or wheezing  Take prednisone 20 mg--2 daily for 3 days  Take benzonatate 100 mg, 1 tab every 8 hours as needed for cough.   You have been swabbed for COVID, and the test will result in the next 24 hours. Our staff will call you if positive. If the COVID test is positive, you should quarantine until you are  fever free for 24 hours and you are starting to feel better, and then take added precautions for the next 5 days, such as physical distancing/wearing a mask and good hand hygiene/washing.       ED Prescriptions     Medication Sig Dispense Auth. Provider   albuterol (VENTOLIN HFA) 108 (90 Base) MCG/ACT inhaler Inhale 2 puffs into the lungs every 4 (four) hours as needed for wheezing or shortness of breath. 1 each Zenia Resides, MD   predniSONE (DELTASONE) 20 MG tablet Take 2 tablets (40 mg total) by mouth daily with breakfast for 3 days. 6 tablet Kierstan Auer, Janace Aris, MD   benzonatate (TESSALON) 100 MG capsule Take 1 capsule (100 mg total) by mouth 3 (three) times daily as needed for cough. 21 capsule Zenia Resides, MD      PDMP not reviewed this encounter.   Zenia Resides, MD 05/04/23 [phone]

## 2023-05-05 LAB — SARS CORONAVIRUS 2 (TAT 6-24 HRS): SARS Coronavirus 2: NEGATIVE

## 2023-05-19 ENCOUNTER — Encounter: Payer: 59 | Admitting: Podiatry

## 2023-06-16 ENCOUNTER — Other Ambulatory Visit (HOSPITAL_COMMUNITY): Payer: Self-pay

## 2023-06-16 MED ORDER — TRULICITY 3 MG/0.5ML ~~LOC~~ SOAJ
3.0000 mg | SUBCUTANEOUS | 3 refills | Status: AC
  Filled 2023-06-16: qty 2, 28d supply, fill #0
  Filled 2023-08-20: qty 2, 28d supply, fill #1
  Filled 2023-09-16: qty 2, 28d supply, fill #2
  Filled 2023-10-25: qty 2, 28d supply, fill #3

## 2023-06-23 ENCOUNTER — Other Ambulatory Visit (HOSPITAL_COMMUNITY): Payer: Self-pay

## 2023-07-27 ENCOUNTER — Other Ambulatory Visit: Payer: Self-pay

## 2023-07-27 ENCOUNTER — Other Ambulatory Visit (HOSPITAL_COMMUNITY): Payer: Self-pay

## 2023-08-24 ENCOUNTER — Other Ambulatory Visit (HOSPITAL_COMMUNITY): Payer: Self-pay

## 2023-08-25 ENCOUNTER — Other Ambulatory Visit (HOSPITAL_COMMUNITY): Payer: Self-pay

## 2023-08-25 MED ORDER — EMPAGLIFLOZIN 25 MG PO TABS
25.0000 mg | ORAL_TABLET | Freq: Every day | ORAL | 2 refills | Status: DC
  Filled 2023-08-31: qty 30, 30d supply, fill #0

## 2023-08-25 MED ORDER — NEXLIZET 180-10 MG PO TABS
1.0000 | ORAL_TABLET | Freq: Every day | ORAL | 2 refills | Status: DC
  Filled 2023-08-25: qty 90, 90d supply, fill #0
  Filled 2023-08-31: qty 30, 30d supply, fill #0

## 2023-08-31 ENCOUNTER — Other Ambulatory Visit (HOSPITAL_COMMUNITY): Payer: Self-pay

## 2023-09-16 ENCOUNTER — Other Ambulatory Visit (HOSPITAL_COMMUNITY): Payer: Self-pay

## 2023-09-16 ENCOUNTER — Other Ambulatory Visit: Payer: Self-pay

## 2023-09-16 MED ORDER — JARDIANCE 25 MG PO TABS
25.0000 mg | ORAL_TABLET | Freq: Every day | ORAL | 2 refills | Status: AC
  Filled 2023-09-16 (×2): qty 90, 90d supply, fill #0
  Filled 2023-09-23: qty 30, 30d supply, fill #0
  Filled 2023-10-25: qty 30, 30d supply, fill #1
  Filled 2023-11-30: qty 30, 30d supply, fill #2
  Filled 2023-12-29: qty 30, 30d supply, fill #3
  Filled 2024-01-24: qty 30, 30d supply, fill #4
  Filled 2024-02-23: qty 30, 30d supply, fill #5
  Filled 2024-03-28: qty 30, 30d supply, fill #6
  Filled 2024-05-02: qty 30, 30d supply, fill #7

## 2023-09-16 MED ORDER — TRULICITY 3 MG/0.5ML ~~LOC~~ SOAJ
3.0000 mg | SUBCUTANEOUS | 5 refills | Status: AC
  Filled 2023-11-18: qty 2, 28d supply, fill #0
  Filled 2023-12-29 – 2024-07-07 (×2): qty 2, 28d supply, fill #1

## 2023-09-17 ENCOUNTER — Encounter (HOSPITAL_COMMUNITY): Payer: Self-pay

## 2023-09-17 ENCOUNTER — Other Ambulatory Visit (HOSPITAL_COMMUNITY): Payer: Self-pay

## 2023-09-21 ENCOUNTER — Other Ambulatory Visit (HOSPITAL_COMMUNITY): Payer: Self-pay

## 2023-09-21 MED ORDER — ICOSAPENT ETHYL 1 G PO CAPS
2.0000 g | ORAL_CAPSULE | Freq: Two times a day (BID) | ORAL | 3 refills | Status: AC
  Filled 2023-09-21 – 2023-11-18 (×2): qty 120, 30d supply, fill #0
  Filled 2024-02-23 – 2024-02-28 (×2): qty 360, 90d supply, fill #0

## 2023-09-23 ENCOUNTER — Other Ambulatory Visit (HOSPITAL_COMMUNITY): Payer: Self-pay

## 2023-09-23 ENCOUNTER — Other Ambulatory Visit: Payer: Self-pay

## 2023-09-24 ENCOUNTER — Other Ambulatory Visit (HOSPITAL_COMMUNITY): Payer: Self-pay

## 2023-09-26 ENCOUNTER — Other Ambulatory Visit (HOSPITAL_COMMUNITY): Payer: Self-pay

## 2023-09-26 MED ORDER — NEXLIZET 180-10 MG PO TABS
1.0000 | ORAL_TABLET | Freq: Every day | ORAL | 2 refills | Status: AC
  Filled 2023-09-26: qty 30, 30d supply, fill #0
  Filled 2023-10-25: qty 30, 30d supply, fill #1
  Filled 2023-11-30 – 2023-12-29 (×2): qty 30, 30d supply, fill #2

## 2023-09-27 ENCOUNTER — Other Ambulatory Visit (HOSPITAL_COMMUNITY): Payer: Self-pay

## 2023-10-25 ENCOUNTER — Other Ambulatory Visit (HOSPITAL_COMMUNITY): Payer: Self-pay

## 2023-10-25 ENCOUNTER — Other Ambulatory Visit: Payer: Self-pay

## 2023-11-18 ENCOUNTER — Other Ambulatory Visit: Payer: Self-pay

## 2023-11-18 ENCOUNTER — Other Ambulatory Visit (HOSPITAL_COMMUNITY): Payer: Self-pay

## 2023-11-30 ENCOUNTER — Other Ambulatory Visit (HOSPITAL_COMMUNITY): Payer: Self-pay

## 2023-11-30 ENCOUNTER — Encounter (HOSPITAL_COMMUNITY): Payer: Self-pay

## 2023-11-30 ENCOUNTER — Other Ambulatory Visit: Payer: Self-pay

## 2023-12-29 ENCOUNTER — Other Ambulatory Visit: Payer: Self-pay

## 2023-12-29 ENCOUNTER — Other Ambulatory Visit (HOSPITAL_COMMUNITY): Payer: Self-pay

## 2023-12-30 ENCOUNTER — Other Ambulatory Visit (HOSPITAL_COMMUNITY): Payer: Self-pay

## 2024-01-20 ENCOUNTER — Other Ambulatory Visit (HOSPITAL_COMMUNITY): Payer: Self-pay

## 2024-01-20 MED ORDER — NEXLIZET 180-10 MG PO TABS
1.0000 | ORAL_TABLET | Freq: Every day | ORAL | 1 refills | Status: DC
  Filled 2024-01-20 – 2024-01-21 (×2): qty 90, 90d supply, fill #0
  Filled 2024-04-13: qty 90, 90d supply, fill #1

## 2024-01-20 MED ORDER — TRULICITY 3 MG/0.5ML ~~LOC~~ SOAJ
3.0000 mg | SUBCUTANEOUS | 1 refills | Status: DC
  Filled 2024-01-20 – 2024-01-25 (×3): qty 6, 84d supply, fill #0
  Filled 2024-04-13: qty 6, 84d supply, fill #1

## 2024-01-21 ENCOUNTER — Other Ambulatory Visit (HOSPITAL_COMMUNITY): Payer: Self-pay

## 2024-01-24 ENCOUNTER — Other Ambulatory Visit: Payer: Self-pay

## 2024-01-24 ENCOUNTER — Other Ambulatory Visit (HOSPITAL_COMMUNITY): Payer: Self-pay

## 2024-01-25 ENCOUNTER — Other Ambulatory Visit (HOSPITAL_COMMUNITY): Payer: Self-pay

## 2024-02-23 ENCOUNTER — Other Ambulatory Visit: Payer: Self-pay

## 2024-02-23 ENCOUNTER — Other Ambulatory Visit (HOSPITAL_COMMUNITY): Payer: Self-pay

## 2024-02-28 ENCOUNTER — Other Ambulatory Visit (HOSPITAL_COMMUNITY): Payer: Self-pay

## 2024-04-13 ENCOUNTER — Other Ambulatory Visit (HOSPITAL_COMMUNITY): Payer: Self-pay

## 2024-05-02 ENCOUNTER — Other Ambulatory Visit (HOSPITAL_COMMUNITY): Payer: Self-pay

## 2024-06-01 ENCOUNTER — Other Ambulatory Visit (HOSPITAL_COMMUNITY): Payer: Self-pay

## 2024-06-01 MED ORDER — JARDIANCE 25 MG PO TABS
25.0000 mg | ORAL_TABLET | Freq: Every day | ORAL | 1 refills | Status: DC
  Filled 2024-06-01: qty 90, 90d supply, fill #0
  Filled 2024-07-31 – 2024-08-25 (×3): qty 90, 90d supply, fill #1

## 2024-07-07 ENCOUNTER — Other Ambulatory Visit (HOSPITAL_COMMUNITY): Payer: Self-pay

## 2024-07-07 MED ORDER — TRULICITY 3 MG/0.5ML ~~LOC~~ SOAJ
3.0000 mg | SUBCUTANEOUS | 1 refills | Status: AC
  Filled 2024-07-07: qty 6, 84d supply, fill #0
  Filled 2024-09-29: qty 6, 84d supply, fill #1

## 2024-07-31 ENCOUNTER — Other Ambulatory Visit: Payer: Self-pay

## 2024-07-31 ENCOUNTER — Other Ambulatory Visit (HOSPITAL_COMMUNITY): Payer: Self-pay

## 2024-07-31 MED ORDER — NEXLIZET 180-10 MG PO TABS
1.0000 | ORAL_TABLET | Freq: Every day | ORAL | 1 refills | Status: AC
  Filled 2024-07-31: qty 90, 90d supply, fill #0
  Filled 2024-10-31: qty 90, 90d supply, fill #1

## 2024-08-01 ENCOUNTER — Other Ambulatory Visit (HOSPITAL_COMMUNITY): Payer: Self-pay

## 2024-09-29 ENCOUNTER — Other Ambulatory Visit (HOSPITAL_COMMUNITY): Payer: Self-pay

## 2024-11-01 ENCOUNTER — Other Ambulatory Visit (HOSPITAL_COMMUNITY): Payer: Self-pay

## 2024-12-12 ENCOUNTER — Other Ambulatory Visit (HOSPITAL_COMMUNITY): Payer: Self-pay

## 2024-12-12 MED ORDER — JARDIANCE 25 MG PO TABS
25.0000 mg | ORAL_TABLET | Freq: Every day | ORAL | 1 refills | Status: AC
  Filled 2024-12-12: qty 90, 90d supply, fill #0

## 2024-12-13 ENCOUNTER — Other Ambulatory Visit (HOSPITAL_COMMUNITY): Payer: Self-pay
